# Patient Record
Sex: Male | Born: 1950 | Race: White | Hispanic: No | Marital: Married | Smoking: Former smoker
Health system: Southern US, Community
[De-identification: ages and names within clinical notes are randomized; demographics above are authoritative.]

## PROBLEM LIST (undated history)

## (undated) DIAGNOSIS — I10 Essential (primary) hypertension: Secondary | ICD-10-CM

## (undated) DIAGNOSIS — N411 Chronic prostatitis: Secondary | ICD-10-CM

## (undated) DIAGNOSIS — I4891 Unspecified atrial fibrillation: Secondary | ICD-10-CM

## (undated) HISTORY — PX: CARDIAC ELECTROPHYSIOLOGY MAPPING AND ABLATION: SHX1292

## (undated) HISTORY — PX: JOINT REPLACEMENT: SHX530

## (undated) HISTORY — PX: REPLACEMENT TOTAL KNEE: SUR1224

---

## 2008-12-10 ENCOUNTER — Observation Stay: Payer: Self-pay | Admitting: Internal Medicine

## 2012-05-17 DIAGNOSIS — G4733 Obstructive sleep apnea (adult) (pediatric): Secondary | ICD-10-CM | POA: Insufficient documentation

## 2012-05-20 DIAGNOSIS — Z79899 Other long term (current) drug therapy: Secondary | ICD-10-CM | POA: Insufficient documentation

## 2015-12-25 DIAGNOSIS — M1612 Unilateral primary osteoarthritis, left hip: Secondary | ICD-10-CM | POA: Insufficient documentation

## 2016-01-01 DIAGNOSIS — M1712 Unilateral primary osteoarthritis, left knee: Secondary | ICD-10-CM | POA: Insufficient documentation

## 2016-01-01 DIAGNOSIS — M1711 Unilateral primary osteoarthritis, right knee: Secondary | ICD-10-CM | POA: Insufficient documentation

## 2016-01-30 ENCOUNTER — Other Ambulatory Visit: Payer: Self-pay | Admitting: Orthopedic Surgery

## 2016-01-30 DIAGNOSIS — M1712 Unilateral primary osteoarthritis, left knee: Secondary | ICD-10-CM

## 2016-02-01 DIAGNOSIS — R972 Elevated prostate specific antigen [PSA]: Secondary | ICD-10-CM | POA: Insufficient documentation

## 2016-02-01 DIAGNOSIS — N401 Enlarged prostate with lower urinary tract symptoms: Secondary | ICD-10-CM | POA: Insufficient documentation

## 2016-02-16 ENCOUNTER — Ambulatory Visit
Admission: RE | Admit: 2016-02-16 | Discharge: 2016-02-16 | Disposition: A | Discharge status: Home / Self Care | Payer: Medicare Other | Source: Ambulatory Visit | Attending: Orthopedic Surgery | Admitting: Orthopedic Surgery

## 2016-02-16 DIAGNOSIS — M2392 Unspecified internal derangement of left knee: Secondary | ICD-10-CM | POA: Insufficient documentation

## 2016-02-16 DIAGNOSIS — M25362 Other instability, left knee: Secondary | ICD-10-CM | POA: Diagnosis present

## 2016-02-16 DIAGNOSIS — M1712 Unilateral primary osteoarthritis, left knee: Secondary | ICD-10-CM

## 2016-02-28 DIAGNOSIS — M249 Joint derangement, unspecified: Secondary | ICD-10-CM | POA: Insufficient documentation

## 2016-03-19 ENCOUNTER — Other Ambulatory Visit: Payer: Self-pay | Admitting: Unknown Physician Specialty

## 2016-03-19 DIAGNOSIS — M1712 Unilateral primary osteoarthritis, left knee: Secondary | ICD-10-CM

## 2016-03-20 ENCOUNTER — Other Ambulatory Visit: Payer: Medicare Other

## 2016-03-21 ENCOUNTER — Ambulatory Visit
Admission: RE | Admit: 2016-03-21 | Discharge: 2016-03-21 | Disposition: A | Discharge status: Home / Self Care | Payer: Medicare Other | Source: Ambulatory Visit | Attending: Unknown Physician Specialty | Admitting: Unknown Physician Specialty

## 2016-03-21 DIAGNOSIS — M160 Bilateral primary osteoarthritis of hip: Secondary | ICD-10-CM | POA: Diagnosis not present

## 2016-03-21 DIAGNOSIS — M1712 Unilateral primary osteoarthritis, left knee: Secondary | ICD-10-CM | POA: Diagnosis present

## 2016-03-26 DIAGNOSIS — M1611 Unilateral primary osteoarthritis, right hip: Secondary | ICD-10-CM | POA: Insufficient documentation

## 2016-04-03 ENCOUNTER — Encounter: Admission: RE | Payer: Self-pay | Source: Ambulatory Visit

## 2016-04-03 ENCOUNTER — Ambulatory Visit
Admission: RE | Admit: 2016-04-03 | Payer: Medicare Other | Source: Ambulatory Visit | Admitting: Unknown Physician Specialty

## 2016-04-03 ENCOUNTER — Encounter
Admission: RE | Admit: 2016-04-03 | Discharge: 2016-04-03 | Disposition: A | Discharge status: Home / Self Care | Payer: Medicare Other | Source: Ambulatory Visit | Attending: Internal Medicine | Admitting: Internal Medicine

## 2016-04-03 SURGERY — ARTHROSCOPY, KNEE
Anesthesia: Choice | Laterality: Left

## 2016-04-09 LAB — PROTIME-INR
INR: 1.13
PROTHROMBIN TIME: 14.7 s (ref 11.4–15.0)

## 2016-04-10 ENCOUNTER — Encounter: Payer: Self-pay | Admitting: Emergency Medicine

## 2016-04-10 ENCOUNTER — Emergency Department: Payer: Medicare Other

## 2016-04-10 ENCOUNTER — Inpatient Hospital Stay
Admission: EM | Admit: 2016-04-10 | Discharge: 2016-04-11 | DRG: 863 | Disposition: A | Discharge status: Skilled Nursing Facility | Payer: Medicare Other | Attending: Internal Medicine | Admitting: Internal Medicine

## 2016-04-10 DIAGNOSIS — Z888 Allergy status to other drugs, medicaments and biological substances status: Secondary | ICD-10-CM | POA: Diagnosis not present

## 2016-04-10 DIAGNOSIS — Z7901 Long term (current) use of anticoagulants: Secondary | ICD-10-CM | POA: Diagnosis not present

## 2016-04-10 DIAGNOSIS — Z96652 Presence of left artificial knee joint: Secondary | ICD-10-CM | POA: Diagnosis present

## 2016-04-10 DIAGNOSIS — I10 Essential (primary) hypertension: Secondary | ICD-10-CM | POA: Diagnosis present

## 2016-04-10 DIAGNOSIS — L0390 Cellulitis, unspecified: Secondary | ICD-10-CM | POA: Diagnosis present

## 2016-04-10 DIAGNOSIS — N411 Chronic prostatitis: Secondary | ICD-10-CM | POA: Diagnosis present

## 2016-04-10 DIAGNOSIS — Z818 Family history of other mental and behavioral disorders: Secondary | ICD-10-CM

## 2016-04-10 DIAGNOSIS — Y838 Other surgical procedures as the cause of abnormal reaction of the patient, or of later complication, without mention of misadventure at the time of the procedure: Secondary | ICD-10-CM | POA: Diagnosis present

## 2016-04-10 DIAGNOSIS — M1990 Unspecified osteoarthritis, unspecified site: Secondary | ICD-10-CM | POA: Diagnosis present

## 2016-04-10 DIAGNOSIS — I482 Chronic atrial fibrillation: Secondary | ICD-10-CM | POA: Diagnosis present

## 2016-04-10 DIAGNOSIS — Z79899 Other long term (current) drug therapy: Secondary | ICD-10-CM | POA: Diagnosis not present

## 2016-04-10 DIAGNOSIS — T814XXA Infection following a procedure, initial encounter: Principal | ICD-10-CM | POA: Diagnosis present

## 2016-04-10 DIAGNOSIS — Z8249 Family history of ischemic heart disease and other diseases of the circulatory system: Secondary | ICD-10-CM | POA: Diagnosis not present

## 2016-04-10 DIAGNOSIS — L03116 Cellulitis of left lower limb: Secondary | ICD-10-CM | POA: Diagnosis present

## 2016-04-10 HISTORY — DX: Chronic prostatitis: N41.1

## 2016-04-10 HISTORY — DX: Unspecified atrial fibrillation: I48.91

## 2016-04-10 HISTORY — DX: Essential (primary) hypertension: I10

## 2016-04-10 LAB — BASIC METABOLIC PANEL
Anion gap: 7 (ref 5–15)
BUN: 13 mg/dL (ref 6–20)
CALCIUM: 9 mg/dL (ref 8.9–10.3)
CO2: 29 mmol/L (ref 22–32)
CREATININE: 0.94 mg/dL (ref 0.61–1.24)
Chloride: 103 mmol/L (ref 101–111)
GFR calc non Af Amer: >60 mL/min (ref >60)
Glucose, Bld: 99 mg/dL (ref 65–99)
Potassium: 4.4 mmol/L (ref 3.5–5.1)
SODIUM: 139 mmol/L (ref 135–145)

## 2016-04-10 LAB — CBC
HCT: 38.3 % — ABNORMAL LOW (ref 40.0–52.0)
HCT: 39.6 % — ABNORMAL LOW (ref 40.0–52.0)
Hemoglobin: 13.2 g/dL (ref 13.0–18.0)
Hemoglobin: 13.5 g/dL (ref 13.0–18.0)
MCH: 31.3 pg (ref 26.0–34.0)
MCH: 31.6 pg (ref 26.0–34.0)
MCHC: 34.1 g/dL (ref 32.0–36.0)
MCHC: 34.3 g/dL (ref 32.0–36.0)
MCV: 91.6 fL (ref 80.0–100.0)
MCV: 92.1 fL (ref 80.0–100.0)
PLATELETS: 266 10*3/uL (ref 150–440)
PLATELETS: 287 10*3/uL (ref 150–440)
RBC: 4.16 MIL/uL — AB (ref 4.40–5.90)
RBC: 4.32 MIL/uL — AB (ref 4.40–5.90)
RDW: 12.9 % (ref 11.5–14.5)
RDW: 13 % (ref 11.5–14.5)
WBC: 8.3 10*3/uL (ref 3.8–10.6)
WBC: 8.7 10*3/uL (ref 3.8–10.6)

## 2016-04-10 LAB — COMPREHENSIVE METABOLIC PANEL
ALT: 25 U/L (ref 17–63)
AST: 28 U/L (ref 15–41)
Albumin: 3.6 g/dL (ref 3.5–5.0)
Alkaline Phosphatase: 59 U/L (ref 38–126)
Anion gap: 9 (ref 5–15)
BUN: 15 mg/dL (ref 6–20)
CHLORIDE: 98 mmol/L — AB (ref 101–111)
CO2: 27 mmol/L (ref 22–32)
CREATININE: 0.95 mg/dL (ref 0.61–1.24)
Calcium: 8.9 mg/dL (ref 8.9–10.3)
Glucose, Bld: 110 mg/dL — ABNORMAL HIGH (ref 65–99)
Potassium: 4.4 mmol/L (ref 3.5–5.1)
Sodium: 134 mmol/L — ABNORMAL LOW (ref 135–145)
Total Bilirubin: 0.4 mg/dL (ref 0.3–1.2)
Total Protein: 7.1 g/dL (ref 6.5–8.1)

## 2016-04-10 LAB — PROTIME-INR
INR: 1.1
INR: 1.15
PROTHROMBIN TIME: 14.4 s (ref 11.4–15.0)
PROTHROMBIN TIME: 14.9 s (ref 11.4–15.0)

## 2016-04-10 LAB — MRSA PCR SCREENING: MRSA BY PCR: NEGATIVE

## 2016-04-10 MED ORDER — TRAZODONE HCL 100 MG PO TABS
100.0000 mg | ORAL_TABLET | Freq: Every day | ORAL | Status: DC
  Administered 2016-04-10: 100 mg via ORAL
  Filled 2016-04-10: qty 1

## 2016-04-10 MED ORDER — SODIUM CHLORIDE 0.9 % IV SOLN: 250.0000 mL | INTRAVENOUS | Status: DC | PRN

## 2016-04-10 MED ORDER — METHOCARBAMOL 500 MG PO TABS
500.0000 mg | ORAL_TABLET | Freq: Four times a day (QID) | ORAL | Status: DC | PRN
  Administered 2016-04-11: 500 mg via ORAL
  Filled 2016-04-10: qty 1

## 2016-04-10 MED ORDER — WARFARIN SODIUM 5 MG PO TABS: 5.0000 mg | ORAL_TABLET | Freq: Every day | ORAL | Status: DC

## 2016-04-10 MED ORDER — HYDROCHLOROTHIAZIDE 12.5 MG PO CAPS
12.5000 mg | ORAL_CAPSULE | Freq: Every day | ORAL | Status: DC
  Administered 2016-04-10 – 2016-04-11 (×2): 12.5 mg via ORAL
  Filled 2016-04-10: qty 1

## 2016-04-10 MED ORDER — BISACODYL 10 MG RE SUPP: 10.0000 mg | RECTAL | Status: DC | PRN

## 2016-04-10 MED ORDER — CARBOXYMETHYLCELLULOSE SODIUM 0.5 % OP SOLN: 1.0000 [drp] | Freq: Three times a day (TID) | OPHTHALMIC | Status: DC | PRN

## 2016-04-10 MED ORDER — MORPHINE SULFATE (PF) 2 MG/ML IV SOLN
2.0000 mg | INTRAVENOUS | Status: DC | PRN
  Administered 2016-04-11: 2 mg via INTRAVENOUS
  Filled 2016-04-10: qty 1

## 2016-04-10 MED ORDER — WARFARIN - PHARMACIST DOSING INPATIENT: Freq: Every day | Status: DC

## 2016-04-10 MED ORDER — ACETAMINOPHEN 325 MG PO TABS
650.0000 mg | ORAL_TABLET | Freq: Four times a day (QID) | ORAL | Status: DC | PRN
  Administered 2016-04-10 – 2016-04-11 (×2): 650 mg via ORAL
  Filled 2016-04-10 (×2): qty 2

## 2016-04-10 MED ORDER — ONDANSETRON HCL 4 MG/2ML IJ SOLN: 4.0000 mg | Freq: Four times a day (QID) | INTRAMUSCULAR | Status: DC | PRN

## 2016-04-10 MED ORDER — MAGNESIUM HYDROXIDE 400 MG/5ML PO SUSP: 30.0000 mL | ORAL | Status: DC | PRN

## 2016-04-10 MED ORDER — WARFARIN SODIUM 5 MG PO TABS
5.0000 mg | ORAL_TABLET | Freq: Once | ORAL | Status: AC
  Administered 2016-04-10: 5 mg via ORAL
  Filled 2016-04-10: qty 1

## 2016-04-10 MED ORDER — SODIUM CHLORIDE 0.9% FLUSH: 3.0000 mL | INTRAVENOUS | Status: DC | PRN

## 2016-04-10 MED ORDER — VANCOMYCIN HCL IN DEXTROSE 1-5 GM/200ML-% IV SOLN
1000.0000 mg | Freq: Once | INTRAVENOUS | Status: AC
  Administered 2016-04-10: 1000 mg via INTRAVENOUS

## 2016-04-10 MED ORDER — SODIUM CHLORIDE 0.9% FLUSH
3.0000 mL | Freq: Two times a day (BID) | INTRAVENOUS | Status: DC
  Administered 2016-04-10 – 2016-04-11 (×3): 3 mL via INTRAVENOUS

## 2016-04-10 MED ORDER — DOFETILIDE 250 MCG PO CAPS
375.0000 ug | ORAL_CAPSULE | Freq: Two times a day (BID) | ORAL | Status: DC
  Administered 2016-04-10 – 2016-04-11 (×3): 375 ug via ORAL
  Filled 2016-04-10 (×4): qty 1

## 2016-04-10 MED ORDER — METOPROLOL SUCCINATE ER 25 MG PO TB24
25.0000 mg | ORAL_TABLET | Freq: Two times a day (BID) | ORAL | Status: DC
  Administered 2016-04-10 – 2016-04-11 (×2): 25 mg via ORAL
  Filled 2016-04-10 (×2): qty 1

## 2016-04-10 MED ORDER — ONDANSETRON HCL 4 MG PO TABS: 4.0000 mg | ORAL_TABLET | Freq: Four times a day (QID) | ORAL | Status: DC | PRN

## 2016-04-10 MED ORDER — LOSARTAN POTASSIUM 50 MG PO TABS
100.0000 mg | ORAL_TABLET | Freq: Every day | ORAL | Status: DC
  Administered 2016-04-10 – 2016-04-11 (×2): 100 mg via ORAL
  Filled 2016-04-10: qty 2

## 2016-04-10 MED ORDER — OXYCODONE HCL 5 MG PO TABS
10.0000 mg | ORAL_TABLET | ORAL | Status: DC | PRN
  Administered 2016-04-10 – 2016-04-11 (×5): 10 mg via ORAL
  Filled 2016-04-10 (×5): qty 2

## 2016-04-10 MED ORDER — VANCOMYCIN HCL IN DEXTROSE 1-5 GM/200ML-% IV SOLN: 1000.0000 mg | Freq: Once | INTRAVENOUS | Status: DC

## 2016-04-10 MED ORDER — BISACODYL 10 MG/30ML RE ENEM: 10.0000 mg | ENEMA | RECTAL | Status: DC

## 2016-04-10 MED ORDER — VANCOMYCIN HCL 10 G IV SOLR
1250.0000 mg | Freq: Three times a day (TID) | INTRAVENOUS | Status: DC
  Administered 2016-04-10 – 2016-04-11 (×4): 1250 mg via INTRAVENOUS
  Filled 2016-04-10 (×6): qty 1250

## 2016-04-10 MED ORDER — OXYCODONE HCL 5 MG PO TABS
5.0000 mg | ORAL_TABLET | ORAL | Status: DC | PRN
  Administered 2016-04-10 (×3): 5 mg via ORAL
  Filled 2016-04-10: qty 1

## 2016-04-10 MED ORDER — PIPERACILLIN-TAZOBACTAM 3.375 G IVPB
3.3750 g | Freq: Once | INTRAVENOUS | Status: AC
  Administered 2016-04-10: 3.375 g via INTRAVENOUS

## 2016-04-10 MED ORDER — PIPERACILLIN-TAZOBACTAM 3.375 G IVPB 30 MIN: 3.3750 g | Freq: Once | INTRAVENOUS | Status: DC

## 2016-04-10 MED ORDER — POLYVINYL ALCOHOL 1.4 % OP SOLN: 1.0000 [drp] | Freq: Three times a day (TID) | OPHTHALMIC | Status: DC | PRN

## 2016-04-10 MED ORDER — OXYCODONE HCL ER 15 MG PO T12A: 15.0000 mg | EXTENDED_RELEASE_TABLET | Freq: Two times a day (BID) | ORAL | Status: DC

## 2016-04-10 MED ORDER — PIPERACILLIN-TAZOBACTAM 3.375 G IVPB
3.3750 g | Freq: Three times a day (TID) | INTRAVENOUS | Status: DC
  Administered 2016-04-10 – 2016-04-11 (×3): 3.375 g via INTRAVENOUS
  Filled 2016-04-10 (×6): qty 50

## 2016-04-10 MED ORDER — SENNOSIDES-DOCUSATE SODIUM 8.6-50 MG PO TABS
1.0000 | ORAL_TABLET | Freq: Two times a day (BID) | ORAL | Status: DC
  Administered 2016-04-10 – 2016-04-11 (×3): 1 via ORAL
  Filled 2016-04-10 (×2): qty 1

## 2016-04-10 MED ORDER — FLEET ENEMA 7-19 GM/118ML RE ENEM: 1.0000 | ENEMA | RECTAL | Status: DC | PRN

## 2016-04-10 MED ORDER — SERTRALINE HCL 50 MG PO TABS
50.0000 mg | ORAL_TABLET | Freq: Every day | ORAL | Status: DC
  Administered 2016-04-11: 50 mg via ORAL
  Filled 2016-04-10: qty 1

## 2016-04-10 MED ORDER — DILTIAZEM HCL ER COATED BEADS 120 MG PO CP24
240.0000 mg | ORAL_CAPSULE | Freq: Every day | ORAL | Status: DC
Start: 2016-04-10 — End: 2016-04-11
  Administered 2016-04-10 – 2016-04-11 (×2): 240 mg via ORAL
  Filled 2016-04-10: qty 2

## 2016-04-10 MED ORDER — ENOXAPARIN SODIUM 120 MG/0.8ML ~~LOC~~ SOLN
1.0000 mg/kg | Freq: Two times a day (BID) | SUBCUTANEOUS | Status: DC
  Administered 2016-04-10: 120 mg via SUBCUTANEOUS
  Filled 2016-04-10 (×2): qty 0.8

## 2016-04-10 MED ORDER — OXYCODONE HCL 5 MG PO TABS
5.0000 mg | ORAL_TABLET | ORAL | Status: DC | PRN
Start: 2016-04-10 — End: 2016-04-10
  Administered 2016-04-10: 10 mg via ORAL
  Filled 2016-04-10: qty 2

## 2016-04-10 NOTE — Clinical Social Work Note (Signed)
Clinical Social Work Assessment  Patient Details  Name: Shane Lamb MRN: [id number] Date of Birth: Jul 31, 1951  Date of referral:  04/10/16               Reason for consult:  Facility Placement                Permission sought to share information with:  Chartered certified accountant granted to share information::  Yes, Verbal Permission Granted  Name::      Shane::   Lamb   Relationship::     Contact Information:     Housing/Transportation Living arrangements for the past 2 months:  [place], [place] of Information:  Patient Patient Interpreter Needed:  None Criminal Activity/Legal Involvement Pertinent to Current Situation/Hospitalization:  No - Comment as needed Significant Relationships:  Friend Lives with:  Self Do you feel safe going back to the place where you live?  Yes Need for family participation in patient care:  Yes (Comment)  Care giving concerns:  Patient lives alone in [place].    Social Worker assessment / plan:  Holiday representative (CSW) reviewed chart and noted that patient is from [facility] Per Kim admissions coordinator at [facility] patient has been at [facility] for a few days however they have no beds so they cannot accept him back. CSW met with patient to discuss D/C plan. Patient was alert and oriented and sitting up in the bed. CSW introduced self and explained role of CSW department. Patient reported that he lives in his mother's house in [place] and went to [facility] for rehab. CSW explained that [facility] does not have a bed available for patient to return to. Patient is agreeable to SNF search in [facility]. FL2 complete and faxed out.   CSW presented bed offers. Patient reported that he will review bed offers and get back to CSW with a choice.   Employment status:  Disabled (Comment on whether or not currently receiving Disability), Retired Radiation protection practitioner:  Medicare PT Recommendations:  Not assessed at this time Information / Referral to community resources:  [facility]  Patient/Family's Response to care:  Patient is agreeable to going to another facility besides [facility].   Patient/Family's Understanding of and Emotional Response to Diagnosis, Current Treatment, and Prognosis:  Patient was pleasant and thanked CSW for visit.   Emotional Assessment Appearance:  Appears stated age Attitude/Demeanor/Rapport:    Affect (typically observed):  Accepting, Adaptable, Pleasant Orientation:  Oriented to Self, Oriented to Place, Oriented to  Time, Oriented to Situation Alcohol / Substance use:  Not Applicable Psych involvement (Current and /or in the community):  No (Comment)  Discharge Needs  Concerns to be addressed:  Discharge Planning Concerns Readmission within the last 30 days:  No Current discharge risk:  Dependent with Mobility Barriers to Discharge:  Continued Medical Work up   Elwyn Reach 04/10/2016, 3:47 PM

## 2016-04-10 NOTE — ED Notes (Signed)
Pt returned from ultrasound

## 2016-04-10 NOTE — Progress Notes (Signed)
Order received from Dr. Gavin PottersKernodle to stop oxycontin and oxycodone 5mg -10mg . Order received for oxycodone 10mg  q 4hrs prn and to start PT for range of motion

## 2016-04-10 NOTE — Progress Notes (Signed)
I examined patient's left lower extremity today. The patient's incision and puncture wounds were healing and were only slightly tender. There was no evidence of infection in regard to his surgical sites. There was some residual swelling in his left leg but minimal swelling about the left thigh. There was no erythematous change. Active and passive motion of his left knee did produce some discomfort. He did have full extension of his left knee but only had about 30-40 of flexion. Attempts at further flexion produced left knee pain. The patient could straight leg raise without too much difficulty.  The patient was still experiencing moderate discomfort. I changed his oxycodone to 10 mg every 4 hours. Foot pumps were reinstituted. Physical therapy is to be started referable to instituting range of motion and strengthening of his left knee. Once his antibiotics are discontinued he should be able to be discharged back to a skilled nursing facility. He will need to follow-up with [facility] in [place] in about 1 week. Temperature is 99.5 at present.

## 2016-04-10 NOTE — Consult Note (Signed)
Patient is a week out from a partial left knee replacement done and arm by Dr. Erin SonsHarold Lamb. He has been in rehabilitation and noticed swelling and increased pain over the left knee over the past 2 days and can emergency room over the night and is being admitted for infection. There is extensive cellulitis involving the left thigh knee and calf. There is no drainage at present. He has difficulty moving the knee.  On examination he holds the knee in about 20 flexion there is mild effusion the knee is not particularly warm compared to the adjacent tissue he is tender medially well above the knee as well as into the calf. No excess erythema around his staple sites from navigation pins or skin incision   Impression extensive cellulitis and setting of recent partial knee replacement  Plan agree with current antibiotics. Holding off on aspiration of the knee and presents because improving I could see the joint with cellulitis extending around the knee. We'll inform Dr. Gavin Lamb total of patient's admission.

## 2016-04-10 NOTE — ED Provider Notes (Signed)
[facility] Emergency Department Provider Note  ____________________________________________  Time seen: 12:19 AM  I have reviewed the triage vital signs and the nursing notes.   HISTORY  Chief Complaint Joint Swelling; Post-op Problem; and Prosthetic Joint Infection      HPI Shane Lamb is a 65 y.o. male presents from [facility] with left leg pain, swelling and redness status post left knee replacement performed by Dr. Gavin PottersKernodle on May 22. In addition patient admits to low-grade fever and chills. Patient stated that he notify the nursing staff at [facility] and was prescribed Keflex however he has noted increased swelling redness and discomfort in his leg since Keflex was started.     Past Medical History  Diagnosis Date  . Hypertension   . Atrial fibrillation (HCC)   . Prostatitis, chronic     There are no active problems to display for this patient.   Past Surgical History  Procedure Laterality Date  . Replacement total knee      No current outpatient prescriptions on file.  Allergies Amiodarone  History reviewed. No pertinent family history.  Social History Social History  Substance Use Topics  . Smoking status: Never Smoker   . Smokeless tobacco: None  . Alcohol Use: Yes    Review of Systems  Constitutional: Negative for fever. Eyes: Negative for visual changes. ENT: Negative for sore throat. Cardiovascular: Negative for chest pain. Respiratory: Negative for shortness of breath. Gastrointestinal: Negative for abdominal pain, vomiting and diarrhea. Genitourinary: Negative for dysuria. Musculoskeletal: Negative for back pain.Positive for left leg swelling Skin: Positive for left leg redness Neurological: Negative for headaches, focal weakness or numbness.   10-point ROS otherwise negative.  ____________________________________________   PHYSICAL EXAM:  VITAL SIGNS: ED Triage Vitals  Enc Vitals  Group     BP 04/10/16 0019 182/90 mmHg     Pulse Rate 04/10/16 0019 76     Resp 04/10/16 0019 20     Temp 04/10/16 0019 98.7 F (37.1 C)     Temp Source 04/10/16 0019 Oral     SpO2 04/10/16 0019 99 %     Weight 04/10/16 0019 260 lb (117.935 kg)     Height 04/10/16 0019 6\' 2"  (1.88 m)     Head Cir --      Peak Flow --      Pain Score 04/10/16 0020 0     Pain Loc --      Pain Edu? --      Excl. in GC? --     Constitutional: Alert and oriented. Well appearing and in no distress. Eyes: Conjunctivae are normal. PERRL. Normal extraocular movements. ENT   Head: Normocephalic and atraumatic.   Nose: No congestion/rhinnorhea.   Mouth/Throat: Mucous membranes are moist.   Neck: No stridor. Hematological/Lymphatic/Immunilogical: No cervical lymphadenopathy. Cardiovascular: Normal rate, regular rhythm. Normal and symmetric distal pulses are present in all extremities. No murmurs, rubs, or gallops. Respiratory: Normal respiratory effort without tachypnea nor retractions. Breath sounds are clear and equal bilaterally. No wheezes/rales/rhonchi. Gastrointestinal: Soft and nontender. No distention. There is no CVA tenderness. Genitourinary: deferred Musculoskeletal: Nontender with normal range of motion in all extremities. No joint effusions.  Left lower extremity nonpitting edema  Neurologic:  Normal speech and language. No gross focal neurologic deficits are appreciated. Speech is normal.  Skin:  Blanching erythema proximal anterior left leg extending to proximal medial thigh Psychiatric: Mood and affect are normal. Speech and behavior are normal. Patient exhibits appropriate insight and  judgment.  ____________________________________________    LABS (pertinent positives/negatives)  Labs Reviewed  CBC - Abnormal; Notable for the following:    RBC 4.32 (*)    HCT 39.6 (*)    All other components within normal limits  COMPREHENSIVE METABOLIC PANEL - Abnormal; Notable for the  following:    Sodium 134 (*)    Chloride 98 (*)    Glucose, Bld 110 (*)    All other components within normal limits  BLOOD GAS, VENOUS - Abnormal; Notable for the following:    pCO2, Ven 43 (*)    pO2, Ven 47.0 (*)    All other components within normal limits  CULTURE, BLOOD (ROUTINE X 2)  CULTURE, BLOOD (ROUTINE X 2)  PROTIME-INR         RADIOLOGY  US Venous Img Lower Unilateral Left (Final result) Result time: 04/10/16 01:34:12   Final result by Rad Results In Interface (04/10/16 01:34:12)   Narrative:   CLINICAL DATA: Acute onset of left leg pain and swelling, status post left knee replacement. Initial encounter.  EXAM: LEFT LOWER EXTREMITY VENOUS DOPPLER ULTRASOUND  TECHNIQUE: Gray-scale sonography with graded compression, as well as color Doppler and duplex ultrasound were performed to evaluate the lower extremity deep venous systems from the level of the common femoral vein and including the common femoral, femoral, profunda femoral, popliteal and calf veins including the posterior tibial, peroneal and gastrocnemius veins when visible. The superficial great saphenous vein was also interrogated. Spectral Doppler was utilized to evaluate flow at rest and with distal augmentation maneuvers in the common femoral, femoral and popliteal veins.  COMPARISON: Left knee MRI performed 02/16/2016, and left knee CT performed 03/21/2016  FINDINGS: Contralateral Common Femoral Vein: Respiratory phasicity is normal and symmetric with the symptomatic side. No evidence of thrombus. Normal compressibility.  Common Femoral Vein: No evidence of thrombus. Normal compressibility, respiratory phasicity and response to augmentation.  Saphenofemoral Junction: No evidence of thrombus. Normal compressibility and flow on color Doppler imaging.  Profunda Femoral Vein: No evidence of thrombus. Normal compressibility and flow on color Doppler imaging.  Femoral Vein: No evidence  of thrombus. Normal compressibility, respiratory phasicity and response to augmentation.  Popliteal Vein: No evidence of thrombus. Normal compressibility, respiratory phasicity and response to augmentation.  Calf Veins: No evidence of thrombus. Normal compressibility and flow on color Doppler imaging.  Superficial Great Saphenous Vein: No evidence of thrombus. Normal compressibility and flow on color Doppler imaging.  Venous Reflux: None.  Other Findings: None.  IMPRESSION: No evidence of deep venous thrombosis.   Electronically Signed By: Roanna Raider M.D. On: 04/10/2016 01:34          INITIAL IMPRESSION / ASSESSMENT AND PLAN / ED COURSE  Pertinent labs & imaging results that were available during my care of the patient were reviewed by me and considered in my medical decision making (see chart for details).  IV vancomycin and Zosyn given. Patient discussed with Dr. Joneen Roach for hospital admission  ____________________________________________   FINAL CLINICAL IMPRESSION(S) / ED DIAGNOSES  Final diagnoses:  Left leg cellulitis      Darci Current, MD 04/10/16 [phone]

## 2016-04-10 NOTE — ED Notes (Signed)
Pt arrived to the ED via EMS from  [facility] for post-op problems. Pt states that he had a left knee replacement but for the last 2 days has been experiencing pain, area swelling and pain. Was placed on antibiotics but wanted a second opinion. Pt is AOx4 in mild pain distress.

## 2016-04-10 NOTE — ED Notes (Signed)
Pt transported to room 147 

## 2016-04-10 NOTE — ED Notes (Signed)
Pt went to ultrasound.

## 2016-04-10 NOTE — ED Notes (Signed)
MD Manson PasseyBrown and Pyreddy at bedside at this time

## 2016-04-10 NOTE — Clinical Social Work Placement (Signed)
   CLINICAL SOCIAL WORK PLACEMENT  NOTE  Date:  04/10/2016  Patient Details  Name: Shane Lamb MRN: [id number] Date of Birth: 21-Jan-1951  Clinical Social Work is seeking post-discharge placement for this patient at the Skilled  Nursing Facility level of care (*CSW will initial, date and re-position this form in  chart as items are completed):  Yes   Patient/family provided with [facility] Clinical Social Work Department's list of facilities offering this level of care within the geographic area requested by the patient (or if unable, by the patient's family).  Yes   Patient/family informed of their freedom to choose among providers that offer the needed level of care, that participate in Medicare, Medicaid or managed care program needed by the patient, have an available bed and are willing to accept the patient.  Yes   Patient/family informed of [facility] ownership interest in [facility] and [facility], as well as of the fact that they are under no obligation to receive care at these facilities.  PASRR submitted to EDS on       PASRR number received on       Existing PASRR number confirmed on 04/10/16     FL2 transmitted to all facilities in geographic area requested by pt/family on 04/10/16     FL2 transmitted to all facilities within larger geographic area on       Patient informed that his/her managed care company has contracts with or will negotiate with certain facilities, including the following:        Yes   Patient/family informed of bed offers received.  Patient chooses bed at       Physician recommends and patient chooses bed at      Patient to be transferred to   on  .  Patient to be transferred to facility by       Patient family notified on   of transfer.  Name of family member notified:        PHYSICIAN       Additional Comment:    _______________________________________________ Haig ProphetMorgan, Harue Pribble G, LCSW 04/10/2016, 3:46 PM

## 2016-04-10 NOTE — Progress Notes (Signed)
Pharmacy Antibiotic Note  Shane RoanLarry D Lamb is a 65 y.o. male admitted on 04/10/2016 with cellulitis.  Pharmacy has been consulted for vancomycin and Zosyn dosing.  Plan: DW 96kg  Vd 67L kei 0.091 hr-1  1/2 8 hours Vancomycin 1250 mg q 8 hours ordered. Not candidate for stacked dosing. Level before 5th dose. Goal trough 15-20.  Zosyn 3.375 grams q 8 hours ordered  Height: 6\' 2"  (188 cm) Weight: 260 lb (117.935 kg) IBW/kg (Calculated) : 82.2  Temp (24hrs), Avg:99.2 F (37.3 C), Min:98.7 F (37.1 C), Max:99.6 F (37.6 C)   Recent Labs Lab 04/10/16 0020  WBC 8.7  CREATININE 0.95    Estimated Creatinine Clearance: 104.4 mL/min (by C-G formula based on Cr of 0.95).    Allergies  Allergen Reactions  . Amiodarone Nausea And Vomiting    Antimicrobials this admission: vancomycin  >>  Zosyn  >>   Dose adjustments this admission:   Microbiology results: 5/31 BCx: pending   Thank you for allowing pharmacy to be a part of this patient's care.  Nicha Hemann S 04/10/2016 4:06 AM

## 2016-04-10 NOTE — Progress Notes (Signed)
Spoke with Dr. Guillermina City[facility]'s nurse, Cordelia PenSherry. Please place orders for V-Pulse if pt has with him, if not use [facility]'s protocol orders for foot pumps and ice pack/polar care.

## 2016-04-10 NOTE — Progress Notes (Signed)
ANTICOAGULATION CONSULT NOTE - Initial Consult  Pharmacy Consult for warfarin Indication: atrial fibrillation  Allergies  Allergen Reactions  . Amiodarone Nausea And Vomiting   Patient Measurements: Height: 6\' 2"  (188 cm) Weight: 260 lb (117.935 kg) IBW/kg (Calculated) : 82.2  Vital Signs: Temp: 99.1 F (37.3 C) (05/31 1128) Temp Source: Oral (05/31 1128) BP: 151/80 mmHg (05/31 1128) Pulse Rate: 76 (05/31 1128)  Labs:  Recent Labs  04/09/16 0900 04/10/16 0020 04/10/16 0559  HGB  --  13.5 13.2  HCT  --  39.6* 38.3*  PLT  --  287 266  LABPROT 14.7 14.4 14.9  INR 1.13 1.10 1.15  CREATININE  --  0.95 0.94   Estimated Creatinine Clearance: 105.5 mL/min (by C-G formula based on Cr of 0.94).  Medical History: Past Medical History  Diagnosis Date  . Hypertension   . Atrial fibrillation (HCC)   . Prostatitis, chronic    Medications:  Scheduled:  . diltiazem  240 mg Oral Daily  . dofetilide  375 mcg Oral BID  . hydrochlorothiazide  12.5 mg Oral Daily  . losartan  100 mg Oral Daily  . metoprolol succinate  25 mg Oral BID  . piperacillin-tazobactam (ZOSYN)  IV  3.375 g Intravenous Q8H  . senna-docusate  1 tablet Oral BID  . sertraline  50 mg Oral Daily  . sodium chloride flush  3 mL Intravenous Q12H  . traZODone  100 mg Oral QHS  . vancomycin  1,250 mg Intravenous Q8H  . warfarin  5 mg Oral ONCE-1800  . Warfarin - Pharmacist Dosing Inpatient   Does not apply q1800    Assessment: Pharmacy consulted to dose and monitor warfarin in this 65 year old male who was taking warfarin prior to admission for atrial fibrillation. Patient was prescribed 3 mg PO daily PTA and appears to be a relatively new start. Patient was initially prescribed therapeutic enoxaparin on admission, which was discontinued today.  INR of 1.15 is subtherapeutic on admission  Dosing history: Date INR Dose 5/31 1.15 5 mg  Goal of Therapy:  INR 2-3 Monitor platelets by anticoagulation protocol:  Yes   Plan:  Will order warfarin 5 mg PO x 1 dose tonight @ 1800 INR will be monitored daily  Thank you for allowing pharmacy to be involved in this patient's care.   Cindi CarbonMary M Melina Mosteller, PharmD Clinical Pharmacist 04/10/2016,3:04 PM

## 2016-04-10 NOTE — H&P (Signed)
[facility] - [place] at [facility]   PATIENT NAME: Shane Lamb    MR#:  [id number]  DATE OF BIRTH:  [birth year]  DATE OF ADMISSION:  04/10/2016  PRIMARY CARE PHYSICIAN: Pcp Not In System   REQUESTING/REFERRING PHYSICIAN:   CHIEF COMPLAINT:   Chief Complaint  Patient presents with  . Joint Swelling  . Post-op Problem  . Prosthetic Joint Infection    HISTORY OF PRESENT ILLNESS: Shane Lamb  is a 65 y.o. male with a known history of Atrial fibrillation, hypertension, chronic prostatitis, left knee replacement a week ago for osteoarthritis was referred from [facility] rehabilitation facility for swelling and pain and tenderness in the left knee and redness around the left knee and extending onto the left leg. The pain is aching in nature 5 out of 10 on a scale of 1-10. Patient is currently in rehabilitation after he was discharged from the hospital after he had a knee replacement. No history of chest pain no history of shortness of breath. Had low-grade fever but no chills. No history of fall. No history of any abdominal pain nausea vomiting diarrhea. No history of any GI bleed. Hospitalist service was consulted for further care of the patient for cellulitis of the left knee and left leg. Patient was worked up with a Doppler venous ultrasound of the left lower extremity in the emergency room which showed no DVT. Patient received IV vancomycin and IV Zosyn antibiotics in the emergency room.  PAST MEDICAL HISTORY:   Past Medical History  Diagnosis Date  . Hypertension   . Atrial fibrillation (HCC)   . Prostatitis, chronic     PAST SURGICAL HISTORY: Past Surgical History  Procedure Laterality Date  . Replacement total knee      SOCIAL HISTORY:  Social History  Substance Use Topics  . Smoking status: Never Smoker   . Smokeless tobacco: Not on file  . Alcohol Use: Yes    FAMILY HISTORY:  Family History  Problem Relation Age of Onset  . Hypertension  Father   . Depression Father     DRUG ALLERGIES:  Allergies  Allergen Reactions  . Amiodarone Nausea And Vomiting    REVIEW OF SYSTEMS:   CONSTITUTIONAL: Low grade fever noted, no fatigue or weakness.  EYES: No blurred or double vision.  EARS, NOSE, AND THROAT: No tinnitus or ear pain.  RESPIRATORY: No cough, shortness of breath, wheezing or hemoptysis.  CARDIOVASCULAR: No chest pain, orthopnea, edema.  GASTROINTESTINAL: No nausea, vomiting, diarrhea or abdominal pain.  GENITOURINARY: No dysuria, hematuria.  ENDOCRINE: No polyuria, nocturia,  HEMATOLOGY: No anemia, easy bruising or bleeding SKIN: No rash or lesion. MUSCULOSKELETAL: Left knee pain and redness left leg. NEUROLOGIC: No tingling, numbness, weakness.  PSYCHIATRY: No anxiety or depression.   MEDICATIONS AT HOME:  Prior to Admission medications   Medication Sig Start Date End Date Taking? Authorizing Provider  acetaminophen (TYLENOL) 325 MG tablet Take 650 mg by mouth every 6 (six) hours as needed.   Yes Historical Provider, MD  bisacodyl (DULCOLAX) 10 MG suppository Place 10 mg rectally as needed for moderate constipation.   Yes Historical Provider, MD  bisacodyl (FLEET) 10 MG/30ML ENEM Place 10 mg rectally See admin instructions. Every 72 hours prn   Yes Historical Provider, MD  carboxymethylcellulose (REFRESH PLUS) 0.5 % SOLN Place 1 drop into both eyes 3 (three) times daily as needed.   Yes Historical Provider, MD  cephALEXin (KEFLEX) 500 MG capsule Take 500 mg by mouth daily.  Yes Historical Provider, MD  diltiazem (DILACOR XR) 240 MG 24 hr capsule Take 240 mg by mouth daily.   Yes Historical Provider, MD  dofetilide (TIKOSYN) 125 MCG capsule Take 375 mcg by mouth 2 (two) times daily.   Yes Historical Provider, MD  hydrochlorothiazide (MICROZIDE) 12.5 MG capsule Take 12.5 mg by mouth daily.   Yes Historical Provider, MD  losartan (COZAAR) 100 MG tablet Take 100 mg by mouth daily.   Yes Historical Provider, MD   magnesium hydroxide (MILK OF MAGNESIA) 400 MG/5ML suspension Take 30 mLs by mouth every 4 (four) hours as needed for mild constipation.   Yes Historical Provider, MD  methocarbamol (ROBAXIN) 500 MG tablet Take 500 mg by mouth every 6 (six) hours as needed for muscle spasms.   Yes Historical Provider, MD  metoprolol succinate (TOPROL-XL) 25 MG 24 hr tablet Take 25 mg by mouth 2 (two) times daily.   Yes Historical Provider, MD  oxycodone (OXY-IR) 5 MG capsule Take 5-15 mg by mouth every 3 (three) hours as needed.   Yes Historical Provider, MD  senna-docusate (SENOKOT-S) 8.6-50 MG tablet Take 1 tablet by mouth 2 (two) times daily.   Yes Historical Provider, MD  sertraline (ZOLOFT) 50 MG tablet Take 50 mg by mouth daily.   Yes Historical Provider, MD  sildenafil (VIAGRA) 100 MG tablet Take 100 mg by mouth daily as needed for erectile dysfunction.   Yes Historical Provider, MD  traZODone (DESYREL) 100 MG tablet Take 100 mg by mouth at bedtime.   Yes Historical Provider, MD  warfarin (COUMADIN) 2.5 MG tablet Take 2.5 mg by mouth daily.   Yes Historical Provider, MD  warfarin (COUMADIN) 3 MG tablet Take 3 mg by mouth daily.   Yes Historical Provider, MD      PHYSICAL EXAMINATION:   VITAL SIGNS: Blood pressure 170/60, pulse 68, temperature 98.7 F (37.1 C), temperature source Oral, resp. rate 18, height 6\' 2"  (1.88 m), weight 117.935 kg (260 lb), SpO2 96 %.  GENERAL:  65 y.o.-year-old patient lying in the bed with no acute distress.  EYES: Pupils equal, round, reactive to light and accommodation. No scleral icterus. Extraocular muscles intact.  HEENT: Head atraumatic, normocephalic. Oropharynx and nasopharynx clear.  NECK:  Supple, no jugular venous distention. No thyroid enlargement, no tenderness.  LUNGS: Normal breath sounds bilaterally, no wheezing, rales,rhonchi or crepitation. No use of accessory muscles of respiration.  CARDIOVASCULAR: S1, S2 normal. No murmurs, rubs, or gallops.  ABDOMEN:  Soft, nontender, nondistended. Bowel sounds present. No organomegaly or mass.  EXTREMITIES: redness around left knee,swelling around left knee, redness over anterior part of left leg.Tenderness of left knee . Staples intact.  NEUROLOGIC: Cranial nerves II through XII are intact. Muscle strength 5/5 in all extremities. Sensation intact. Gait not checked.  PSYCHIATRIC: The patient is alert and oriented x 3.  SKIN: No obvious rash, lesion, or ulcer.   LABORATORY PANEL:   CBC  Recent Labs Lab 04/10/16 0020  WBC 8.7  HGB 13.5  HCT 39.6*  PLT 287  MCV 91.6  MCH 31.3  MCHC 34.1  RDW 12.9   ------------------------------------------------------------------------------------------------------------------  Chemistries   Recent Labs Lab 04/10/16 0020  NA 134*  K 4.4  CL 98*  CO2 27  GLUCOSE 110*  BUN 15  CREATININE 0.95  CALCIUM 8.9  AST 28  ALT 25  ALKPHOS 59  BILITOT 0.4   ------------------------------------------------------------------------------------------------------------------ estimated creatinine clearance is 104.4 mL/min (by C-G formula based on Cr of 0.95). ------------------------------------------------------------------------------------------------------------------ No results  for input(s): TSH, T4TOTAL, T3FREE, THYROIDAB in the last 72 hours.  Invalid input(s): FREET3   Coagulation profile  Recent Labs Lab 04/09/16 0900 04/10/16 0020  INR 1.13 1.10   ------------------------------------------------------------------------------------------------------------------- No results for input(s): DDIMER in the last 72 hours. -------------------------------------------------------------------------------------------------------------------  Cardiac Enzymes No results for input(s): CKMB, TROPONINI, MYOGLOBIN in the last 168 hours.  Invalid input(s):  CK ------------------------------------------------------------------------------------------------------------------ Invalid input(s): POCBNP  ---------------------------------------------------------------------------------------------------------------  Urinalysis No results found for: COLORURINE, APPEARANCEUR, LABSPEC, PHURINE, GLUCOSEU, HGBUR, BILIRUBINUR, KETONESUR, PROTEINUR, UROBILINOGEN, NITRITE, LEUKOCYTESUR   RADIOLOGY: US Venous Img Lower Unilateral Left  04/10/2016  CLINICAL DATA:  Acute onset of left leg pain and swelling, status post left knee replacement. Initial encounter. EXAM: LEFT LOWER EXTREMITY VENOUS DOPPLER ULTRASOUND TECHNIQUE: Gray-scale sonography with graded compression, as well as color Doppler and duplex ultrasound were performed to evaluate the lower extremity deep venous systems from the level of the common femoral vein and including the common femoral, femoral, profunda femoral, popliteal and calf veins including the posterior tibial, peroneal and gastrocnemius veins when visible. The superficial great saphenous vein was also interrogated. Spectral Doppler was utilized to evaluate flow at rest and with distal augmentation maneuvers in the common femoral, femoral and popliteal veins. COMPARISON:  Left knee MRI performed 02/16/2016, and left knee CT performed 03/21/2016 FINDINGS: Contralateral Common Femoral Vein: Respiratory phasicity is normal and symmetric with the symptomatic side. No evidence of thrombus. Normal compressibility. Common Femoral Vein: No evidence of thrombus. Normal compressibility, respiratory phasicity and response to augmentation. Saphenofemoral Junction: No evidence of thrombus. Normal compressibility and flow on color Doppler imaging. Profunda Femoral Vein: No evidence of thrombus. Normal compressibility and flow on color Doppler imaging. Femoral Vein: No evidence of thrombus. Normal compressibility, respiratory phasicity and response to  augmentation. Popliteal Vein: No evidence of thrombus. Normal compressibility, respiratory phasicity and response to augmentation. Calf Veins: No evidence of thrombus. Normal compressibility and flow on color Doppler imaging. Superficial Great Saphenous Vein: No evidence of thrombus. Normal compressibility and flow on color Doppler imaging. Venous Reflux:  None. Other Findings:  None. IMPRESSION: No evidence of deep venous thrombosis. Electronically Signed   By: Roanna Raider M.D.   On: 04/10/2016 01:34    EKG: No orders found for this or any previous visit.  IMPRESSION AND PLAN: 65 year old male patient history of left knee replacement, chronic prostatitis, hypertension, atrial fibrillation presented to the emergency room with left knee swelling and redness and tenderness around the left knee extending onto the anterior part of the left leg. Admitting diagnosis 1. Left knee and left leg cellulitis 2. Chronic atrial fibrillation 3. Hypertension 4. Chronic prostatitis Treatment plan Admit patient to medical floor Start patient on IV vancomycin and IV Zosyn antibiotics. Patient was on anticoagulation with Coumadin as an outpatient, INR is subtherapeutic. Will start patient on increased dose of Coumadin along with full dose of Lovenox subcutaneously until INR is therapeutic. Resume Cardizem for rate control Pain management Follow-up sodium level Supportive care Orthopedic consultation for follow-up.  All the records are reviewed and case discussed with ED provider. Management plans discussed with the patient, family and they are in agreement.  CODE STATUS:FULL Code Status History    This patient does not have a recorded code status. Please follow your organizational policy for patients in this situation.       TOTAL TIME TAKING CARE OF THIS PATIENT: 50 minutes.    Ihor Austin M.D on 04/10/2016 at 2:56 AM  Between 7am to 6pm - Pager - [phone]  After 6pm go to [email or web address]  - password [email or web address]  [facility]  Office  [phone]  CC: Primary care physician; Pcp Not In System

## 2016-04-10 NOTE — NC FL2 (Signed)
[place] MEDICAID FL2 LEVEL OF CARE SCREENING TOOL     IDENTIFICATION  Patient Name: Shane Lamb Birthdate: [birth year] Sex: male Admission Date (Current Location): 04/10/2016  [place] and [place] Number:  Chiropodist and Address:  [facility], [address] [place], [place] [address]      Provider Number: [id number]  Attending Physician Name and Address:  Milagros Loll, MD  Relative Name and Phone Number:       Current Level of Care: Hospital Recommended Level of Care: Skilled Nursing Facility Prior Approval Number:    Date Approved/Denied:   PASRR Number:  ( [id number] )  Discharge Plan: SNF    Current Diagnoses: Patient Active Problem List   Diagnosis Date Noted  . Cellulitis 04/10/2016    Orientation RESPIRATION BLADDER Height & Weight     Self, Time, Situation, Place  Normal Continent Weight: 260 lb (117.935 kg) Height:   (188 cm)  BEHAVIORAL SYMPTOMS/MOOD NEUROLOGICAL BOWEL NUTRITION STATUS   (none )  (none ) Continent Diet (Diet: Heart Healthy )  AMBULATORY STATUS COMMUNICATION OF NEEDS Skin   Extensive Assist Verbally Surgical wounds (Incision: Left Knee )                       Personal Care Assistance Level of Assistance  Bathing, Feeding, Dressing Bathing Assistance: Limited assistance Feeding assistance: Independent Dressing Assistance: Limited assistance     Functional Limitations Info  Sight, Hearing, Speech Sight Info: Adequate Hearing Info: Adequate Speech Info: Adequate    SPECIAL CARE FACTORS FREQUENCY  PT (By licensed PT), OT (By licensed OT)     PT Frequency:  (5) OT Frequency:  (5)            Contractures      Additional Factors Info  Code Status, Allergies Code Status Info:  (Full Code. ) Allergies Info:  (Amiodarone)           Current Medications (04/10/2016):  This is the current hospital active medication list Current Facility-Administered Medications   Medication Dose Route Frequency Provider Last Rate Last Dose  . 0.9 %  sodium chloride infusion  250 mL Intravenous PRN Ihor Austin, MD      . acetaminophen (TYLENOL) tablet 650 mg  650 mg Oral Q6H PRN Ihor Austin, MD      . bisacodyl (DULCOLAX) suppository 10 mg  10 mg Rectal PRN Ihor Austin, MD      . diltiazem (CARDIZEM CD) 24 hr capsule 240 mg  240 mg Oral Daily Ihor Austin, MD   240 mg at 04/10/16 0841  . dofetilide (TIKOSYN) capsule 375 mcg  375 mcg Oral BID Ihor Austin, MD   375 mcg at 04/10/16 0842  . enoxaparin (LOVENOX) injection 120 mg  1 mg/kg Subcutaneous Q12H Pavan Pyreddy, MD   120 mg at 04/10/16 0839  . hydrochlorothiazide (MICROZIDE) capsule 12.5 mg  12.5 mg Oral Daily Ihor Austin, MD   12.5 mg at 04/10/16 0842  . losartan (COZAAR) tablet 100 mg  100 mg Oral Daily Ihor Austin, MD   100 mg at 04/10/16 0840  . magnesium hydroxide (MILK OF MAGNESIA) suspension 30 mL  30 mL Oral Q4H PRN Pavan Pyreddy, MD      . methocarbamol (ROBAXIN) tablet 500 mg  500 mg Oral Q6H PRN Pavan Pyreddy, MD      . metoprolol succinate (TOPROL-XL) 24 hr tablet 25 mg  25 mg Oral BID Ihor Austin, MD   25  mg at 04/10/16 0841  . morphine 2 MG/ML injection 2 mg  2 mg Intravenous Q4H PRN Pavan Pyreddy, MD      . ondansetron (ZOFRAN) tablet 4 mg  4 mg Oral Q6H PRN Ihor AustinPavan Pyreddy, MD       Or  . ondansetron (ZOFRAN) injection 4 mg  4 mg Intravenous Q6H PRN Pavan Pyreddy, MD      . oxyCODONE (Oxy IR/ROXICODONE) immediate release tablet 5 mg  5 mg Oral Q3H PRN Ihor AustinPavan Pyreddy, MD   5 mg at 04/10/16 0843  . piperacillin-tazobactam (ZOSYN) IVPB 3.375 Lamb  3.375 Lamb Intravenous Q8H Pavan Pyreddy, MD      . polyvinyl alcohol (LIQUIFILM TEARS) 1.4 % ophthalmic solution 1 drop  1 drop Both Eyes TID PRN Ihor AustinPavan Pyreddy, MD      . senna-docusate (Senokot-S) tablet 1 tablet  1 tablet Oral BID Ihor AustinPavan Pyreddy, MD   1 tablet at 04/10/16 0901  . sertraline (ZOLOFT) tablet 50 mg  50 mg Oral Daily Ihor AustinPavan Pyreddy, MD   50  mg at 04/10/16 1000  . sodium chloride flush (NS) 0.9 % injection 3 mL  3 mL Intravenous Q12H Ihor AustinPavan Pyreddy, MD   3 mL at 04/10/16 0854  . sodium chloride flush (NS) 0.9 % injection 3 mL  3 mL Intravenous PRN Ihor AustinPavan Pyreddy, MD      . sodium phosphate (FLEET) 7-19 GM/118ML enema 1 enema  1 enema Rectal Q72H PRN Ihor AustinPavan Pyreddy, MD      . traZODone (DESYREL) tablet 100 mg  100 mg Oral QHS Pavan Pyreddy, MD      . vancomycin (VANCOCIN) 1,250 mg in sodium chloride 0.9 % 250 mL IVPB  1,250 mg Intravenous Q8H Pavan Pyreddy, MD   1,250 mg at 04/10/16 0845  . warfarin (COUMADIN) tablet 5 mg  5 mg Oral q1800 Ihor AustinPavan Pyreddy, MD         Discharge Medications: Please see discharge summary for a list of discharge medications.  Relevant Imaging Results:  Relevant Lab Results:   Additional Information  (SSN: [id number])  Shane ProphetMorgan, Shane Keelan Lamb, Shane Lamb

## 2016-04-11 ENCOUNTER — Encounter
Admission: RE | Admit: 2016-04-11 | Discharge: 2016-04-11 | Disposition: A | Discharge status: Home / Self Care | Payer: Medicare Other | Source: Ambulatory Visit | Attending: Internal Medicine | Admitting: Internal Medicine

## 2016-04-11 LAB — BLOOD GAS, VENOUS
ACID-BASE EXCESS: 1.3 mmol/L (ref 0.0–3.0)
BICARBONATE: 26.6 meq/L (ref 21.0–28.0)
O2 SAT: 82.6 %
PATIENT TEMPERATURE: 37
pCO2, Ven: 43 mmHg — ABNORMAL LOW (ref 44.0–60.0)
pH, Ven: 7.4 (ref 7.320–7.430)
pO2, Ven: 47 mmHg — ABNORMAL HIGH (ref 31.0–45.0)

## 2016-04-11 LAB — PROTIME-INR
INR: 1.16
PROTHROMBIN TIME: 15 s (ref 11.4–15.0)

## 2016-04-11 LAB — POTASSIUM: POTASSIUM: 4.3 mmol/L (ref 3.5–5.1)

## 2016-04-11 LAB — VANCOMYCIN, TROUGH: Vancomycin Tr: 21 ug/mL (ref 10–20)

## 2016-04-11 LAB — MAGNESIUM: Magnesium: 2.5 mg/dL — ABNORMAL HIGH (ref 1.7–2.4)

## 2016-04-11 MED ORDER — WARFARIN SODIUM 5 MG PO TABS: 5.0000 mg | ORAL_TABLET | Freq: Once | ORAL | Status: DC

## 2016-04-11 MED ORDER — CEPHALEXIN 500 MG PO CAPS
500.0000 mg | ORAL_CAPSULE | Freq: Three times a day (TID) | ORAL | Status: DC
Start: 2016-04-11 — End: 2022-11-25

## 2016-04-11 MED ORDER — OXYCODONE HCL 5 MG PO CAPS
10.0000 mg | ORAL_CAPSULE | ORAL | Status: DC | PRN
Start: 2016-04-11 — End: 2022-11-25

## 2016-04-11 MED ORDER — WARFARIN SODIUM 4 MG PO TABS: 4.0000 mg | ORAL_TABLET | Freq: Once | ORAL | Status: DC

## 2016-04-11 MED ORDER — PSYLLIUM 95 % PO PACK
1.0000 | PACK | Freq: Once | ORAL | Status: AC
  Administered 2016-04-11: 1 via ORAL
  Filled 2016-04-11: qty 1

## 2016-04-11 NOTE — Progress Notes (Signed)
Pharmacy Antibiotic Note  Shane Lamb is a 65 y.o. male admitted on 04/10/2016 with cellulitis.  Pharmacy has been consulted for vancomycin and Zosyn dosing.  Plan: DW 96kg  Vd 67L kei 0.091 hr-1  1/2 8 hours Vancomycin 1250 mg q 8 hours ordered. Not candidate for stacked dosing. Level before 5th dose. Goal trough 15-20.  Zosyn 3.375 grams q 8 hours ordered  Height: 6\' 2"  (188 cm) Weight: 260 lb (117.935 kg) IBW/kg (Calculated) : 82.2  Temp (24hrs), Avg:99.1 F (37.3 C), Min:97.6 F (36.4 C), Max:99.6 F (37.6 C)   Recent Labs Lab 04/10/16 0020 04/10/16 0559  WBC 8.7 8.3  CREATININE 0.95 0.94    Estimated Creatinine Clearance: 105.5 mL/min (by C-G formula based on Cr of 0.94).    Allergies  Allergen Reactions  . Amiodarone Nausea And Vomiting    Antimicrobials this admission: vancomycin  >>  Zosyn  >>   Dose adjustments this admission:  6/1 AM vanc level 21. Drawn ~1.5 hours early. Will continue current regimen and recheck level before next dose.   Microbiology results: 5/31 BCx: pending   Thank you for allowing pharmacy to be a part of this patient's care.  Yoselin Amerman S 04/11/2016 6:27 AM

## 2016-04-11 NOTE — Clinical Social Work Note (Signed)
Pt is ready for discharge today and [facility]   CSW presented bed offers, and [facility] Facility was able to pick up pt's belongings. Pt updated his friend. RN called report and [facility].   CSW is signing off as no further needs identified.   Dede QuerySarah Tavi Gaughran, MSW, LCSW  Clinical Social Worker  [phone]

## 2016-04-11 NOTE — Discharge Summary (Signed)
[facility] [place] at [facility]   PATIENT NAME: Shane CzechLarry Lamb    MR#:  [id number]  DATE OF BIRTH:  April 21, 1951  DATE OF ADMISSION:  04/10/2016 ADMITTING PHYSICIAN: Ihor AustinPavan Pyreddy, MD  DATE OF DISCHARGE: No discharge date for patient encounter.  PRIMARY CARE PHYSICIAN: Pcp Not In System   ADMISSION DIAGNOSIS:  Left leg cellulitis [L03.116]  DISCHARGE DIAGNOSIS:  Principal Problem:   Cellulitis   SECONDARY DIAGNOSIS:   Past Medical History  Diagnosis Date  . Hypertension   . Atrial fibrillation (HCC)   . Prostatitis, chronic      ADMITTING HISTORY   HISTORY OF PRESENT ILLNESS: Shane Lamb is a 65 y.o. male with a known history of Atrial fibrillation, hypertension, chronic prostatitis, left knee replacement a week ago for osteoarthritis was [facility] for swelling and pain and tenderness in the left knee and redness around the left knee and extending onto the left leg. The pain is aching in nature 5 out of 10 on a scale of 1-10. Patient is currently in rehabilitation after he was discharged from the hospital after he had a knee replacement. No history of chest pain no history of shortness of breath. Had low-grade fever but no chills. No history of fall. No history of any abdominal pain nausea vomiting diarrhea. No history of any GI bleed. Hospitalist service was consulted for further care of the patient for cellulitis of the left knee and left leg. Patient was worked up with a Doppler venous ultrasound of the left lower extremity in the emergency room which showed no DVT. Patient received IV vancomycin and IV Zosyn antibiotics in the emergency room.  HOSPITAL COURSE:   65 year old male patient history of left knee replacement, chronic prostatitis, hypertension, atrial fibrillation presented to the emergency room with left knee swelling and redness and tenderness around the left knee extending onto the anterior part of  the left leg. Admitting diagnosis 1. Left knee and left leg cellulitis - Improving 2. Chronic atrial fibrillation - On Tikosyn, metoprolol and Coumadin 3. Hypertension 4. Chronic prostatitis  Seen by orthopedics. Treated with Vancomycin and Zosyn. Iproved well. Cx negative. Will treat with Keflex for 1 more week.  F/u Dr. Jena GaussKernodle-Orthopedics - 1 week.  Activity as before.  CONSULTS OBTAINED:  Treatment Team:  Erin SonsHarold Kernodle, MD  DRUG ALLERGIES:   Allergies  Allergen Reactions  . Amiodarone Nausea And Vomiting    DISCHARGE MEDICATIONS:   Current Discharge Medication List    CONTINUE these medications which have CHANGED   Details  cephALEXin (KEFLEX) 500 MG capsule Take 1 capsule (500 mg total) by mouth 3 (three) times daily. Qty: 21 capsule, Refills: 0    oxycodone (OXY-IR) 5 MG capsule Take 2 capsules (10 mg total) by mouth every 4 (four) hours as needed. Qty: 30 capsule, Refills: 0    warfarin (COUMADIN) 4 MG tablet Take 1 tablet (4 mg total) by mouth one time only at 6 PM.      CONTINUE these medications which have NOT CHANGED   Details  acetaminophen (TYLENOL) 325 MG tablet Take 650 mg by mouth every 6 (six) hours as needed.    bisacodyl (DULCOLAX) 10 MG suppository Place 10 mg rectally as needed for moderate constipation.    bisacodyl (FLEET) 10 MG/30ML ENEM Place 10 mg rectally See admin instructions. Every 72 hours prn    carboxymethylcellulose (REFRESH PLUS) 0.5 % SOLN Place 1 drop into both eyes 3 (three) times daily as needed.  diltiazem (DILACOR XR) 240 MG 24 hr capsule Take 240 mg by mouth daily.    dofetilide (TIKOSYN) 125 MCG capsule Take 375 mcg by mouth 2 (two) times daily.    hydrochlorothiazide (MICROZIDE) 12.5 MG capsule Take 12.5 mg by mouth daily.    losartan (COZAAR) 100 MG tablet Take 100 mg by mouth daily.    magnesium hydroxide (MILK OF MAGNESIA) 400 MG/5ML suspension Take 30 mLs by mouth every 4 (four) hours as needed for mild  constipation.    methocarbamol (ROBAXIN) 500 MG tablet Take 500 mg by mouth every 6 (six) hours as needed for muscle spasms.    metoprolol succinate (TOPROL-XL) 25 MG 24 hr tablet Take 25 mg by mouth 2 (two) times daily.    senna-docusate (SENOKOT-S) 8.6-50 MG tablet Take 1 tablet by mouth 2 (two) times daily.    sertraline (ZOLOFT) 50 MG tablet Take 50 mg by mouth daily.    sildenafil (VIAGRA) 100 MG tablet Take 100 mg by mouth daily as needed for erectile dysfunction.    traZODone (DESYREL) 100 MG tablet Take 100 mg by mouth at bedtime.        Today   VITAL SIGNS:  Blood pressure 146/80, pulse 72, temperature 98.9 F (37.2 C), temperature source Oral, resp. rate 18, height  (1.88 m), weight 117.935 kg (260 lb), SpO2 98 %.  I/O:   Intake/Output Summary (Last 24 hours) at 04/11/16 0955 Last data filed at 04/11/16 0800  Gross per 24 hour  Intake   1370 ml  Output   1450 ml  Net    -80 ml    PHYSICAL EXAMINATION:  Physical Exam  GENERAL:  65 y.o.-year-old patient lying in the bed with no acute distress.  LUNGS: Normal breath sounds bilaterally, no wheezing, rales,rhonchi or crepitation. No use of accessory muscles of respiration.  CARDIOVASCULAR: S1, S2 normal. No murmurs, rubs, or gallops.  ABDOMEN: Soft, non-tender, non-distended. Bowel sounds present. No organomegaly or mass.  NEUROLOGIC: Moves all 4 extremities. PSYCHIATRIC: The patient is alert and oriented x 3.  SKIN: Surgical scar left knee is less red. No discharge.  DATA REVIEW:   CBC  Recent Labs Lab 04/10/16 0559  WBC 8.3  HGB 13.2  HCT 38.3*  PLT 266    Chemistries   Recent Labs Lab 04/10/16 0020 04/10/16 0559 04/11/16 0541  NA 134* 139  --   K 4.4 4.4 4.3  CL 98* 103  --   CO2 27 29  --   GLUCOSE 110* 99  --   BUN 15 13  --   CREATININE 0.95 0.94  --   CALCIUM 8.9 9.0  --   MG  --   --  2.5*  AST 28  --   --   ALT 25  --   --   ALKPHOS 59  --   --   BILITOT 0.4  --   --      Cardiac Enzymes No results for input(s): TROPONINI in the last 168 hours.  Microbiology Results  Results for orders placed or performed during the hospital encounter of 04/10/16  Blood culture (routine x 2)     Status: None (Preliminary result)   Collection Time: 04/10/16 12:19 AM  Result Value Ref Range Status   Specimen Description BLOOD RIGHT WRIST  Final   Special Requests BOTTLES DRAWN AEROBIC AND ANAEROBIC  Final   Culture NO GROWTH < 12 HOURS  Final   Report Status PENDING  Incomplete  Blood culture (routine x 2)     Status: None (Preliminary result)   Collection Time: 04/10/16 12:24 AM  Result Value Ref Range Status   Specimen Description BLOOD RIGHT ARM  Final   Special Requests BOTTLES DRAWN AEROBIC AND ANAEROBIC  Final   Culture NO GROWTH < 12 HOURS  Final   Report Status PENDING  Incomplete  MRSA PCR Screening     Status: None   Collection Time: 04/10/16  2:55 PM  Result Value Ref Range Status   MRSA by PCR NEGATIVE NEGATIVE Final    Comment:        The GeneXpert MRSA Assay (FDA approved for NASAL specimens only), is one component of a comprehensive MRSA colonization surveillance program. It is not intended to diagnose MRSA infection nor to guide or monitor treatment for MRSA infections.     RADIOLOGY:  US Venous Img Lower Unilateral Left  04/10/2016  CLINICAL DATA:  Acute onset of left leg pain and swelling, status post left knee replacement. Initial encounter. EXAM: LEFT LOWER EXTREMITY VENOUS DOPPLER ULTRASOUND TECHNIQUE: Gray-scale sonography with graded compression, as well as color Doppler and duplex ultrasound were performed to evaluate the lower extremity deep venous systems from the level of the common femoral vein and including the common femoral, femoral, profunda femoral, popliteal and calf veins including the posterior tibial, peroneal and gastrocnemius veins when visible. The superficial great saphenous vein was also interrogated.  Spectral Doppler was utilized to evaluate flow at rest and with distal augmentation maneuvers in the common femoral, femoral and popliteal veins. COMPARISON:  Left knee MRI performed 02/16/2016, and left knee CT performed 03/21/2016 FINDINGS: Contralateral Common Femoral Vein: Respiratory phasicity is normal and symmetric with the symptomatic side. No evidence of thrombus. Normal compressibility. Common Femoral Vein: No evidence of thrombus. Normal compressibility, respiratory phasicity and response to augmentation. Saphenofemoral Junction: No evidence of thrombus. Normal compressibility and flow on color Doppler imaging. Profunda Femoral Vein: No evidence of thrombus. Normal compressibility and flow on color Doppler imaging. Femoral Vein: No evidence of thrombus. Normal compressibility, respiratory phasicity and response to augmentation. Popliteal Vein: No evidence of thrombus. Normal compressibility, respiratory phasicity and response to augmentation. Calf Veins: No evidence of thrombus. Normal compressibility and flow on color Doppler imaging. Superficial Great Saphenous Vein: No evidence of thrombus. Normal compressibility and flow on color Doppler imaging. Venous Reflux:  None. Other Findings:  None. IMPRESSION: No evidence of deep venous thrombosis. Electronically Signed   By: Roanna Raider M.D.   On: 04/10/2016 01:34    Follow up with PCP in 1 week.  Management plans discussed with the patient, family and they are in agreement.  CODE STATUS:     Code Status Orders        Start     Ordered   04/10/16 0350  Full code   Continuous     04/10/16 0349    Code Status History    Date Active Date Inactive Code Status Order ID Comments User Context   This patient has a current code status but no historical code status.      TOTAL TIME TAKING CARE OF THIS PATIENT ON DAY OF DISCHARGE: more than 30 minutes.   Milagros Loll R M.D on 04/11/2016 at 9:55 AM  Between 7am to 6pm - Pager -  [phone]  After 6pm go to [email or web address] - password [email or web address]  [facility]  Office  [phone]  CC: Primary care physician; Pcp Not In System  Note:  This dictation was prepared with Dragon dictation along with smaller phrase technology. Any transcriptional errors that result from this process are unintentional.

## 2016-04-11 NOTE — Clinical Social Work Placement (Signed)
   CLINICAL SOCIAL WORK PLACEMENT  NOTE  Date:  04/11/2016  Patient Details  Name: Shane RoanLarry D Lamb MRN: [id number] Date of Birth: July 26, 1951  Clinical Social Work is seeking post-discharge placement for this patient at the Skilled  Nursing Facility level of care (*CSW will initial, date and re-position this form in  chart as items are completed):  Yes   Patient/family provided with [place] Clinical Social Work Department's list of facilities offering this level of care within the geographic area requested by the patient (or if unable, by the patient's family).  Yes   Patient/family informed of their freedom to choose among providers that offer the needed level of care, that participate in Medicare, Medicaid or managed care program needed by the patient, have an available bed and are willing to accept the patient.  Yes   Patient/family informed of [place]'s ownership interest in [facility] and [facility], as well as of the fact that they are under no obligation to receive care at these facilities.  PASRR submitted to EDS on       PASRR number received on       Existing PASRR number confirmed on 04/10/16     FL2 transmitted to all facilities in geographic area requested by pt/family on 04/10/16     FL2 transmitted to all facilities within larger geographic area on       Patient informed that his/her managed care company has contracts with or will negotiate with certain facilities, including the following:        Yes   Patient/family informed of bed offers received.  [facility] [place]     Physician recommends and patient chooses bed at  [facility](S[facility])    [facility]  on 04/11/16[facility]  Patient to be transferred to facility [facility]     Patient family notified on 04/11/16 of transfer.  Name of family member notified:  Pt declined     PHYSICIAN       Additional Comment:     _______________________________________________ Dede QuerySarah Doyt Castellana, LCSW 04/11/2016, 2:16 PM

## 2016-04-11 NOTE — Progress Notes (Signed)
  Subjective:     Patient reports pain as mild.   Patient seen in rounds with Dr. Gavin PottersKernodle. Patient is well, and has had no acute complaints or problems.  Improving with swelling and pain. Plan is to go Rehab after hospital stay. Negative for chest pain and shortness of breath Fever: no Gastrointestinal:negative for nausea and vomiting  Objective: Vital signs in last 24 hours: Temp:  [97.6 F (36.4 C)-99.6 F (37.6 C)] 97.6 F (36.4 C) (06/01 0511) Pulse Rate:  [62-76] 62 (06/01 0511) Resp:  [16-22] 22 (06/01 0511) BP: (96-154)/(31-80) 125/76 mmHg (06/01 0511) SpO2:  [94 %-98 %] 96 % (06/01 0511)  Intake/Output from previous day:  Intake/Output Summary (Last 24 hours) at 04/11/16 0729 Last data filed at 04/11/16 0559  Gross per 24 hour  Intake   1420 ml  Output   1150 ml  Net    270 ml    Intake/Output this shift:    Labs:  Recent Labs  04/10/16 0020 04/10/16 0559  HGB 13.5 13.2    Recent Labs  04/10/16 0020 04/10/16 0559  WBC 8.7 8.3  RBC 4.32* 4.16*  HCT 39.6* 38.3*  PLT 287 266    Recent Labs  04/10/16 0020 04/10/16 0559 04/11/16 0541  NA 134* 139  --   K 4.4 4.4 4.3  CL 98* 103  --   CO2 27 29  --   BUN 15 13  --   CREATININE 0.95 0.94  --   GLUCOSE 110* 99  --   CALCIUM 8.9 9.0  --     Recent Labs  04/10/16 0559 04/11/16 0541  INR 1.15 1.16     EXAM General - Patient is Alert and Oriented Extremity - Sensation intact distally Intact pulses distally No cellulitis present Compartment soft.  Mild effusion, as expected.  Erythema diminished, with no signs of active Cellulitis. Dressing/Incision - clean, dry, no drainage Motor Function - intact, moving foot and toes well on exam.   Past Medical History  Diagnosis Date  . Hypertension   . Atrial fibrillation (HCC)   . Prostatitis, chronic     Assessment/Plan:     Principal Problem:   Cellulitis  Estimated body mass index is 33.37 kg/(m^2) as calculated from the  following:   Height as of this encounter: 6\' 2"  (1.88 m).   Weight as of this encounter: 117.935 kg (260 lb). Up with therapy Discharge to SNF, when cleared medically.  Cellulitis resolved with Abx.  Feel like patient is ready to go back to a Rehab facility.    DVT Prophylaxis - Lovenox and Coumadin, since not therapeutic Weight-Bearing as tolerated to left leg  Dedra Skeensodd Brette Cast, PA-C Orthopaedic Surgery 04/11/2016, 7:29 AM

## 2016-04-11 NOTE — Evaluation (Signed)
Physical Therapy Evaluation Patient Details Name: Shane Lamb MRN: [id number] DOB: [birth year] Today's Date: 04/11/2016   History of Present Illness  Pt admited for cellulitis following L LE partial knee replacement 1 week ago. Pt with complaints of pain and swelling x 2 days. [facility]. Plans to dc back this date.  Clinical Impression  Pt is a pleasant 65 year old male who was admitted for L LE cellulitis post partial knee replacement. Pt performs transfers and ambulation with cga and SW. Pt demonstrates deficits with strength/pain/mobility. Pt limited secondary to pain. Pt able to perform 10 SLRs therefore does not need KI at this time. Would benefit from skilled PT to address above deficits and promote optimal return to PLOF.      Follow Up Recommendations SNF    Equipment Recommendations       Recommendations for Other Services       Precautions / Restrictions Precautions Precautions: Fall;Knee Precaution Booklet Issued: Yes (comment) Restrictions Weight Bearing Restrictions: Yes LLE Weight Bearing: Weight bearing as tolerated      Mobility  Bed Mobility               General bed mobility comments: not performed as pt received seated at EOB  Transfers Overall transfer level: Needs assistance Equipment used: Standard walker Transfers: Sit to/from Stand           General transfer comment: Pt performed transfer from elevated surface. Required cga for completion of transfer. Once standing, pt able to stand with supervision. Safe technique performed  Ambulation/Gait Ambulation/Gait assistance: Min guard Ambulation Distance (Feet): 5 Feet Assistive device: Standard walker Gait Pattern/deviations: Step-to pattern     General Gait Details: ambulated to recliner secondary to pain. Pt with safe technique with step to gait pattern. Pt educated on WB status during mobility.  Stairs            Wheelchair Mobility    Modified Rankin  (Stroke Patients Only)       Balance Overall balance assessment: Needs assistance Sitting-balance support: Feet supported Sitting balance-Leahy Scale: Normal     Standing balance support: Bilateral upper extremity supported Standing balance-Leahy Scale: Good                               Pertinent Vitals/Pain Pain Assessment: 0-10 Pain Score: 3  Pain Location: L knee Pain Descriptors / Indicators: Operative site guarding Pain Intervention(s): Limited activity within patient's tolerance;Ice applied    Home Living Family/patient expects to be discharged to:: Skilled nursing facility                      Prior Function Level of Independence: Needs assistance         Comments: Pt reports ambulating with rehab staff using RW     Hand Dominance        Extremity/Trunk Assessment   Upper Extremity Assessment: Overall WFL for tasks assessed           Lower Extremity Assessment: LLE deficits/detail (grossly 3+/5, able to perform 10 SLRs)         Communication   Communication: No difficulties  Cognition Arousal/Alertness: Awake/alert Behavior During Therapy: Restless;Impulsive Overall Cognitive Status: Within Functional Limits for tasks assessed                      General Comments      Exercises Total  Joint Exercises Goniometric ROM: L LE AAROM: 3-60 degrees Other Exercises Other Exercises: Pt performed seated ther-ex including ankle pumps, quad sets, SLRs, hip abd/add, and seated knee flexion stretches. All ther-ex performed x 10 reps with cga      Assessment/Plan    PT Assessment Patient needs continued PT services  PT Diagnosis Difficulty walking;Generalized weakness;Acute pain   PT Problem List Decreased strength;Decreased range of motion;Decreased mobility;Pain  PT Treatment Interventions DME instruction;Gait training;Therapeutic exercise   PT Goals (Current goals can be found in the Care Plan section) Acute Rehab  PT Goals Patient Stated Goal: to get stronger PT Goal Formulation: With patient Time For Goal Achievement: 04/25/16 Potential to Achieve Goals: Good    Frequency 7X/week   Barriers to discharge        Co-evaluation               End of Session Equipment Utilized During Treatment: Gait belt Activity Tolerance: Patient limited by pain Patient left: in chair;with chair alarm set Nurse Communication: Mobility status         Time: 1610-9604 PT Time Calculation (min) (ACUTE ONLY): 26 min   Charges:   PT Evaluation $PT Eval Moderate Complexity: 1 Procedure PT Treatments $Therapeutic Exercise: 8-22 mins   PT G Codes:        [name] 04-25-16, 1:06 PM  [name], PT, DPT [phone]

## 2016-04-11 NOTE — Discharge Instructions (Signed)
°  DIET:  °Cardiac diet ° °DISCHARGE CONDITION:  °Stable ° °ACTIVITY:  °Activity as tolerated ° °OXYGEN:  °Home Oxygen: No. °  °Oxygen Delivery: room air ° °DISCHARGE LOCATION:  °nursing home  ° °If you experience worsening of your admission symptoms, develop shortness of breath, life threatening emergency, suicidal or homicidal thoughts you must seek medical attention immediately by calling 911 or calling your MD immediately  if symptoms less severe. ° °You Must read complete instructions/literature along with all the possible adverse reactions/side effects for all the Medicines you take and that have been prescribed to you. Take any new Medicines after you have completely understood and accpet all the possible adverse reactions/side effects.  ° °Please note ° °You were cared for by a hospitalist during your hospital stay. If you have any questions about your discharge medications or the care you received while you were in the hospital after you are discharged, you can call the unit and asked to speak with the hospitalist on call if the hospitalist that took care of you is not available. Once you are discharged, your primary care physician will handle any further medical issues. Please note that NO REFILLS for any discharge medications will be authorized once you are discharged, as it is imperative that you return to your primary care physician (or establish a relationship with a primary care physician if you do not have one) for your aftercare needs so that they can reassess your need for medications and monitor your lab values. ° ° ° °

## 2016-04-11 NOTE — Progress Notes (Signed)
PT DISCHARGED TO [facility] VIA EMS. REPORT CALLED  TO [facility]. SPARE HONEYCOMB DRESSING  SENT TO FACILITY. [facility] RETRIEVED VALUABLES  PRIOR TO DISCHARGE AND MEDS SENT  WITH PT

## 2016-04-11 NOTE — Progress Notes (Signed)
ANTICOAGULATION CONSULT NOTE - Initial Consult  Pharmacy Consult for warfarin Indication: atrial fibrillation  Allergies  Allergen Reactions  . Amiodarone Nausea And Vomiting   Patient Measurements: Height: 6\' 2"  (188 cm) Weight: 260 lb (117.935 kg) IBW/kg (Calculated) : 82.2  Vital Signs: Temp: 98.9 F (37.2 C) (06/01 0745) Temp Source: Oral (06/01 0745) BP: 146/80 mmHg (06/01 0745) Pulse Rate: 72 (06/01 0745)  Labs:  Recent Labs  04/10/16 0020 04/10/16 0559 04/11/16 0541  HGB 13.5 13.2  --   HCT 39.6* 38.3*  --   PLT 287 266  --   LABPROT 14.4 14.9 15.0  INR 1.10 1.15 1.16  CREATININE 0.95 0.94  --    Estimated Creatinine Clearance: 105.5 mL/min (by C-G formula based on Cr of 0.94).  Medical History: Past Medical History  Diagnosis Date  . Hypertension   . Atrial fibrillation (HCC)   . Prostatitis, chronic    Medications:  Scheduled:  . diltiazem  240 mg Oral Daily  . dofetilide  375 mcg Oral BID  . hydrochlorothiazide  12.5 mg Oral Daily  . losartan  100 mg Oral Daily  . metoprolol succinate  25 mg Oral BID  . piperacillin-tazobactam (ZOSYN)  IV  3.375 g Intravenous Q8H  . senna-docusate  1 tablet Oral BID  . sertraline  50 mg Oral Daily  . sodium chloride flush  3 mL Intravenous Q12H  . traZODone  100 mg Oral QHS  . vancomycin  1,250 mg Intravenous Q8H  . warfarin  5 mg Oral ONCE-1800  . Warfarin - Pharmacist Dosing Inpatient   Does not apply q1800    Assessment: Pharmacy consulted to dose and monitor warfarin in this 65 year old male who was taking warfarin prior to admission for atrial fibrillation. Patient was prescribed 3 mg PO daily PTA and appears to be a relatively new start.   INR of 1.16 remains subtherapeutic, which is expected as warfarin was resumed yesterday and it will take ~3 days to see full effects.  Dosing history: Date INR Dose 5/31 1.15 5 mg 6/1 1.16 6 mg  Goal of Therapy:  INR 2-3 Monitor platelets by anticoagulation  protocol: Yes   Plan:  Will order warfarin 5 mg PO x 1 dose tonight @ 1800 INR will be monitored daily  Thank you for allowing pharmacy to be involved in this patient's care.   Cindi CarbonMary M Hazell Siwik, PharmD Clinical Pharmacist 04/11/2016,8:54 AM

## 2016-04-15 LAB — CULTURE, BLOOD (ROUTINE X 2)
CULTURE: NO GROWTH
Culture: NO GROWTH

## 2016-05-02 DIAGNOSIS — Z96652 Presence of left artificial knee joint: Secondary | ICD-10-CM | POA: Insufficient documentation

## 2016-06-29 DIAGNOSIS — Z8679 Personal history of other diseases of the circulatory system: Secondary | ICD-10-CM | POA: Insufficient documentation

## 2016-07-01 DIAGNOSIS — G8929 Other chronic pain: Secondary | ICD-10-CM | POA: Insufficient documentation

## 2016-07-01 DIAGNOSIS — M25562 Pain in left knee: Secondary | ICD-10-CM | POA: Insufficient documentation

## 2016-09-17 DIAGNOSIS — Z79899 Other long term (current) drug therapy: Secondary | ICD-10-CM | POA: Insufficient documentation

## 2016-12-12 DIAGNOSIS — Z8679 Personal history of other diseases of the circulatory system: Secondary | ICD-10-CM | POA: Insufficient documentation

## 2017-04-14 DIAGNOSIS — Z8679 Personal history of other diseases of the circulatory system: Secondary | ICD-10-CM | POA: Insufficient documentation

## 2017-04-14 DIAGNOSIS — E669 Obesity, unspecified: Secondary | ICD-10-CM | POA: Insufficient documentation

## 2017-04-14 DIAGNOSIS — E876 Hypokalemia: Secondary | ICD-10-CM | POA: Insufficient documentation

## 2017-04-14 DIAGNOSIS — R7303 Prediabetes: Secondary | ICD-10-CM | POA: Insufficient documentation

## 2017-04-15 DIAGNOSIS — I472 Ventricular tachycardia: Secondary | ICD-10-CM | POA: Insufficient documentation

## 2017-04-15 DIAGNOSIS — I2510 Atherosclerotic heart disease of native coronary artery without angina pectoris: Secondary | ICD-10-CM | POA: Insufficient documentation

## 2017-04-15 DIAGNOSIS — R000 Tachycardia, unspecified: Secondary | ICD-10-CM | POA: Insufficient documentation

## 2017-06-10 DIAGNOSIS — G8929 Other chronic pain: Secondary | ICD-10-CM | POA: Insufficient documentation

## 2017-06-10 DIAGNOSIS — G894 Chronic pain syndrome: Secondary | ICD-10-CM | POA: Insufficient documentation

## 2017-06-10 DIAGNOSIS — M47816 Spondylosis without myelopathy or radiculopathy, lumbar region: Secondary | ICD-10-CM | POA: Insufficient documentation

## 2017-06-10 DIAGNOSIS — M160 Bilateral primary osteoarthritis of hip: Secondary | ICD-10-CM | POA: Insufficient documentation

## 2017-06-10 DIAGNOSIS — M1990 Unspecified osteoarthritis, unspecified site: Secondary | ICD-10-CM | POA: Insufficient documentation

## 2018-02-06 ENCOUNTER — Ambulatory Visit: Payer: Medicare Other | Attending: Internal Medicine

## 2018-02-06 DIAGNOSIS — R0683 Snoring: Secondary | ICD-10-CM | POA: Diagnosis present

## 2018-02-06 DIAGNOSIS — G4733 Obstructive sleep apnea (adult) (pediatric): Secondary | ICD-10-CM | POA: Diagnosis not present

## 2018-02-06 DIAGNOSIS — E669 Obesity, unspecified: Secondary | ICD-10-CM | POA: Insufficient documentation

## 2018-02-06 DIAGNOSIS — G4761 Periodic limb movement disorder: Secondary | ICD-10-CM | POA: Insufficient documentation

## 2018-02-06 DIAGNOSIS — I10 Essential (primary) hypertension: Secondary | ICD-10-CM | POA: Diagnosis present

## 2018-02-25 ENCOUNTER — Ambulatory Visit: Payer: Medicare Other | Attending: Internal Medicine

## 2018-02-25 DIAGNOSIS — G4733 Obstructive sleep apnea (adult) (pediatric): Secondary | ICD-10-CM | POA: Insufficient documentation

## 2018-02-25 DIAGNOSIS — G4761 Periodic limb movement disorder: Secondary | ICD-10-CM | POA: Diagnosis not present

## 2018-05-25 ENCOUNTER — Ambulatory Visit
Admission: RE | Admit: 2018-05-25 | Discharge: 2018-05-25 | Disposition: A | Discharge status: Home / Self Care | Payer: Medicare Other | Source: Ambulatory Visit | Attending: Internal Medicine | Admitting: Internal Medicine

## 2018-05-25 ENCOUNTER — Other Ambulatory Visit: Payer: Self-pay | Admitting: Internal Medicine

## 2018-05-25 DIAGNOSIS — R600 Localized edema: Secondary | ICD-10-CM

## 2018-05-25 DIAGNOSIS — I480 Paroxysmal atrial fibrillation: Secondary | ICD-10-CM

## 2018-05-25 DIAGNOSIS — Z8679 Personal history of other diseases of the circulatory system: Secondary | ICD-10-CM | POA: Insufficient documentation

## 2018-05-25 DIAGNOSIS — I10 Essential (primary) hypertension: Secondary | ICD-10-CM | POA: Insufficient documentation

## 2018-05-25 DIAGNOSIS — G4733 Obstructive sleep apnea (adult) (pediatric): Secondary | ICD-10-CM | POA: Diagnosis not present

## 2018-05-28 DIAGNOSIS — Z7901 Long term (current) use of anticoagulants: Secondary | ICD-10-CM | POA: Insufficient documentation

## 2018-06-23 ENCOUNTER — Other Ambulatory Visit: Payer: Self-pay

## 2018-06-23 ENCOUNTER — Emergency Department
Admission: EM | Admit: 2018-06-23 | Discharge: 2018-06-23 | Disposition: A | Discharge status: Home / Self Care | Payer: Medicare Other | Attending: Emergency Medicine | Admitting: Emergency Medicine

## 2018-06-23 ENCOUNTER — Emergency Department: Payer: Medicare Other

## 2018-06-23 DIAGNOSIS — W19XXXA Unspecified fall, initial encounter: Secondary | ICD-10-CM

## 2018-06-23 DIAGNOSIS — Y9355 Activity, bike riding: Secondary | ICD-10-CM | POA: Diagnosis not present

## 2018-06-23 DIAGNOSIS — Z23 Encounter for immunization: Secondary | ICD-10-CM | POA: Insufficient documentation

## 2018-06-23 DIAGNOSIS — S0990XA Unspecified injury of head, initial encounter: Secondary | ICD-10-CM | POA: Diagnosis present

## 2018-06-23 DIAGNOSIS — Y998 Other external cause status: Secondary | ICD-10-CM | POA: Insufficient documentation

## 2018-06-23 DIAGNOSIS — I4891 Unspecified atrial fibrillation: Secondary | ICD-10-CM | POA: Diagnosis not present

## 2018-06-23 DIAGNOSIS — S0121XA Laceration without foreign body of nose, initial encounter: Secondary | ICD-10-CM | POA: Diagnosis not present

## 2018-06-23 DIAGNOSIS — I10 Essential (primary) hypertension: Secondary | ICD-10-CM | POA: Insufficient documentation

## 2018-06-23 DIAGNOSIS — R000 Tachycardia, unspecified: Secondary | ICD-10-CM | POA: Insufficient documentation

## 2018-06-23 DIAGNOSIS — Z79899 Other long term (current) drug therapy: Secondary | ICD-10-CM | POA: Diagnosis not present

## 2018-06-23 DIAGNOSIS — Z7901 Long term (current) use of anticoagulants: Secondary | ICD-10-CM | POA: Diagnosis not present

## 2018-06-23 DIAGNOSIS — Y929 Unspecified place or not applicable: Secondary | ICD-10-CM | POA: Insufficient documentation

## 2018-06-23 DIAGNOSIS — Z96659 Presence of unspecified artificial knee joint: Secondary | ICD-10-CM | POA: Insufficient documentation

## 2018-06-23 LAB — BASIC METABOLIC PANEL
Anion gap: 8 (ref 5–15)
BUN: 9 mg/dL (ref 8–23)
CALCIUM: 9.1 mg/dL (ref 8.9–10.3)
CO2: 23 mmol/L (ref 22–32)
CREATININE: 1.03 mg/dL (ref 0.61–1.24)
Chloride: 108 mmol/L (ref 98–111)
GFR calc Af Amer: >60 mL/min (ref >60)
GLUCOSE: 115 mg/dL — AB (ref 70–99)
Potassium: 4.3 mmol/L (ref 3.5–5.1)
Sodium: 139 mmol/L (ref 135–145)

## 2018-06-23 LAB — CBC
HCT: 45.1 % (ref 40.0–52.0)
Hemoglobin: 15.7 g/dL (ref 13.0–18.0)
MCH: 31.6 pg (ref 26.0–34.0)
MCHC: 34.9 g/dL (ref 32.0–36.0)
MCV: 90.6 fL (ref 80.0–100.0)
PLATELETS: 212 10*3/uL (ref 150–440)
RBC: 4.98 MIL/uL (ref 4.40–5.90)
RDW: 15.7 % — AB (ref 11.5–14.5)
WBC: 9.6 10*3/uL (ref 3.8–10.6)

## 2018-06-23 LAB — TROPONIN I

## 2018-06-23 LAB — BRAIN NATRIURETIC PEPTIDE: B Natriuretic Peptide: 16 pg/mL (ref 0.0–100.0)

## 2018-06-23 MED ORDER — DILTIAZEM HCL 60 MG PO TABS
30.0000 mg | ORAL_TABLET | Freq: Once | ORAL | Status: AC
  Administered 2018-06-23: 30 mg via ORAL
  Filled 2018-06-23: qty 1

## 2018-06-23 MED ORDER — ACETAMINOPHEN 500 MG PO TABS
1000.0000 mg | ORAL_TABLET | Freq: Once | ORAL | Status: AC
  Administered 2018-06-23: 1000 mg via ORAL

## 2018-06-23 MED ORDER — TETANUS-DIPHTH-ACELL PERTUSSIS 5-2.5-18.5 LF-MCG/0.5 IM SUSP
0.5000 mL | Freq: Once | INTRAMUSCULAR | Status: AC
  Administered 2018-06-23: 0.5 mL via INTRAMUSCULAR
  Filled 2018-06-23: qty 0.5

## 2018-06-23 MED ORDER — ACETAMINOPHEN 500 MG PO TABS
ORAL_TABLET | ORAL | Status: AC
  Filled 2018-06-23: qty 2
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
## 2018-06-23 NOTE — ED Notes (Addendum)
Pt in NAD. Bleeding under control with skin glue. Meal tray provided.
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
## 2020-02-18 IMAGING — US US EXTREM LOW VENOUS BILAT
1 series · 13 of 24 positions shown · non-contrast
Comparison: Left lower extremity venous Doppler ultrasound -
04/10/2016

CLINICAL DATA: Bilateral lower extremity edema. History of atrial
fibrillation. Evaluate for DVT.



[Series 1: us extrem low venous bilat · 13 of 59 slices shown]
[im 1/59]
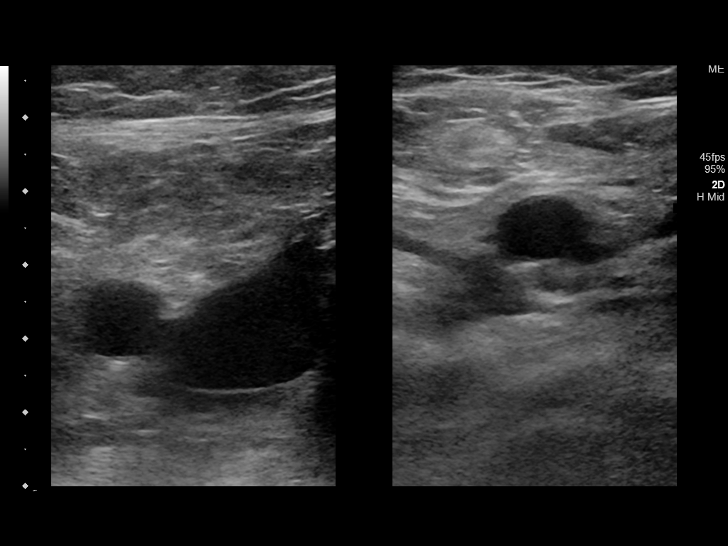
[im 6/59]
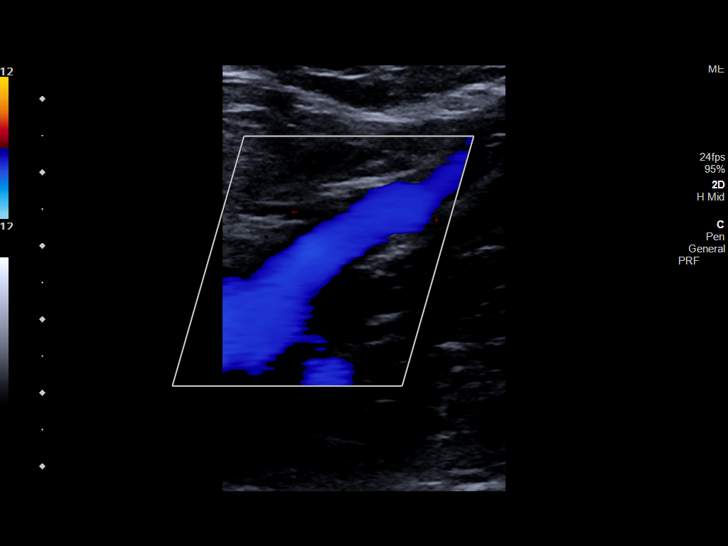
[im 11/59]
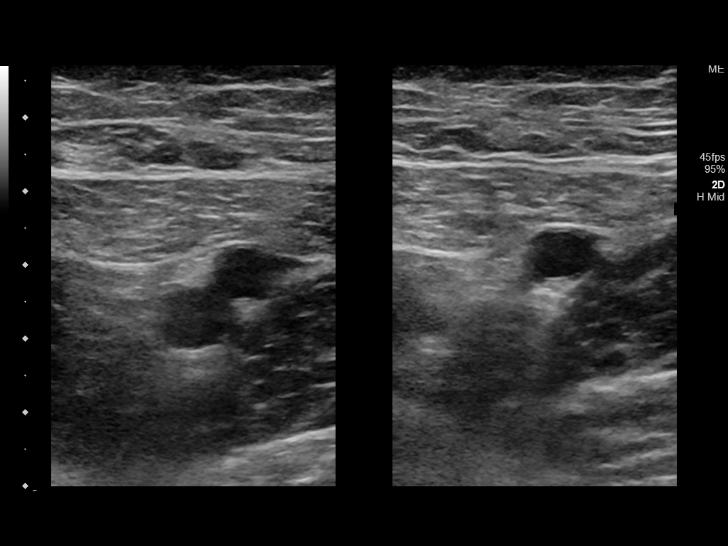
[im 16/59]
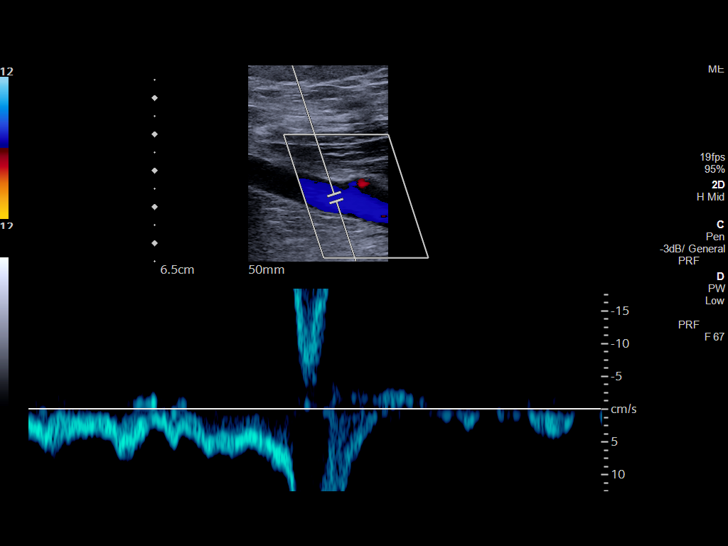
[im 21/59]
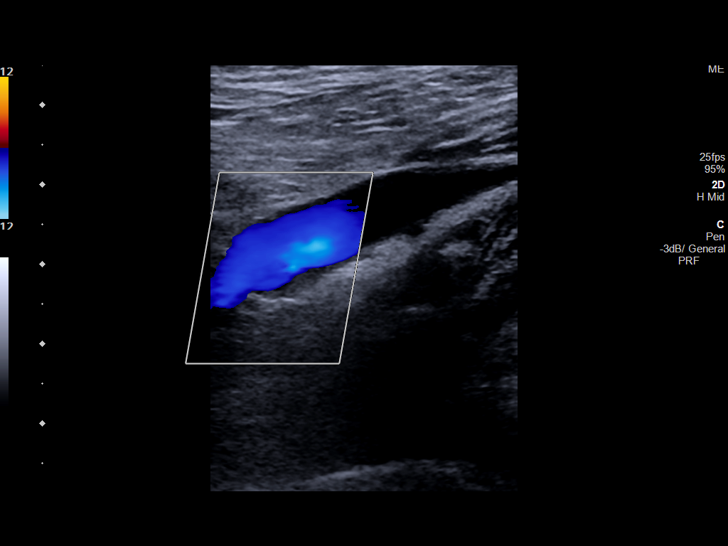
[im 26/59]
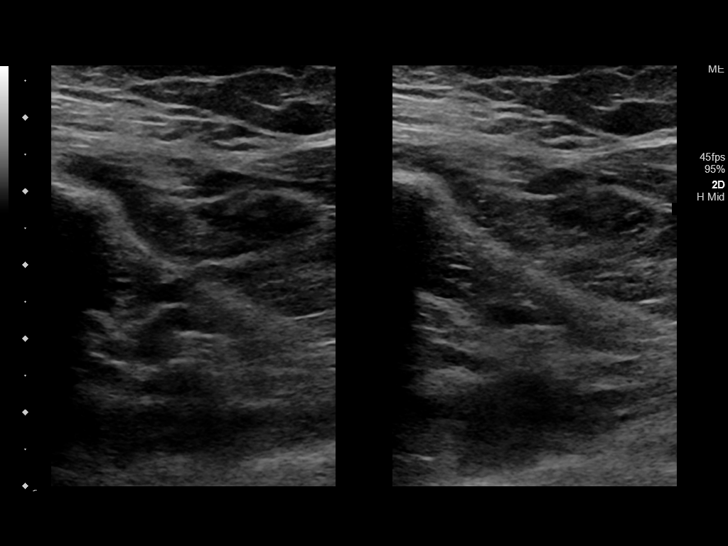
[im 31/59]
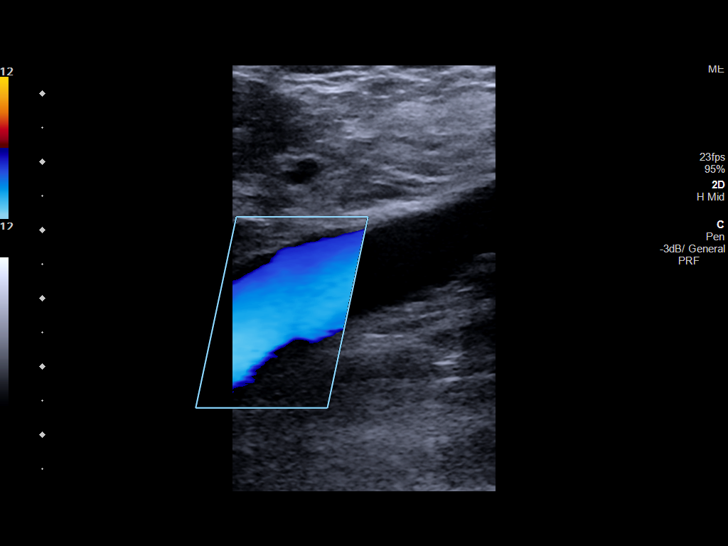
[im 33/59]
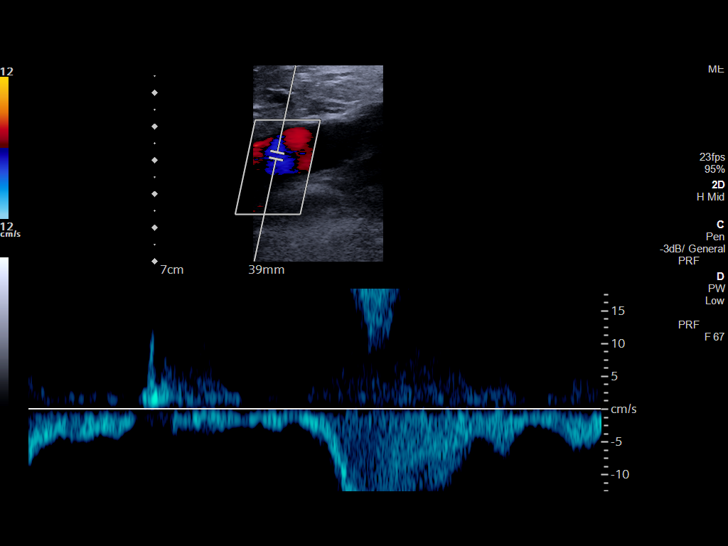
[im 38/59]
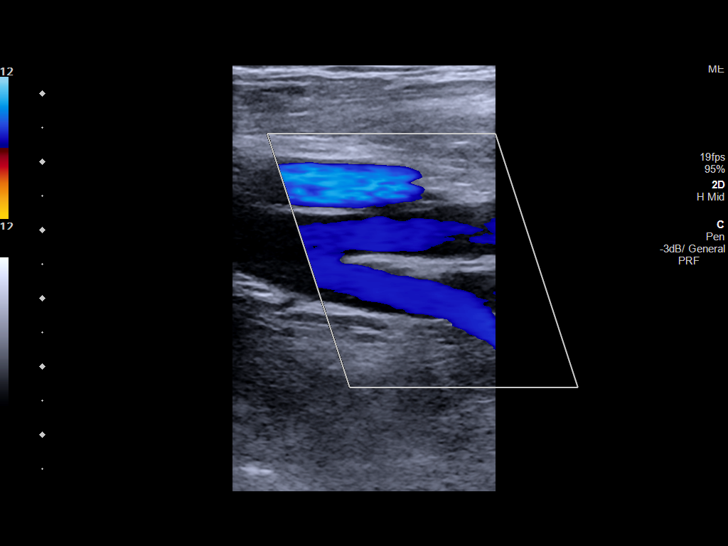
[im 43/59]
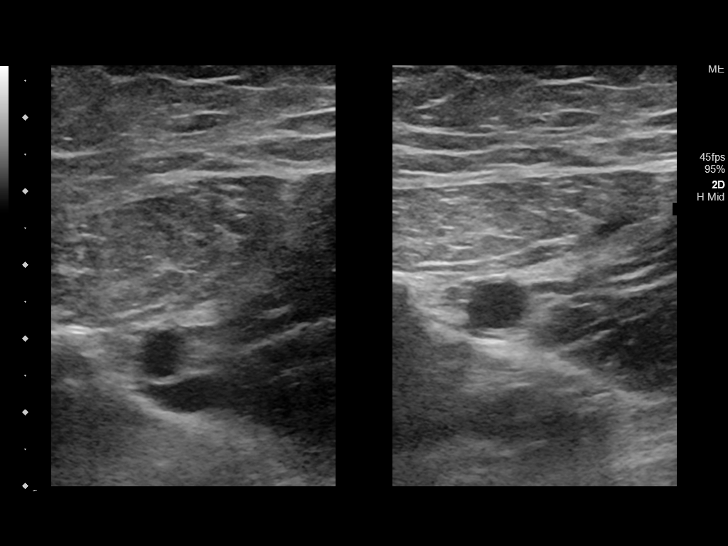
[im 48/59]
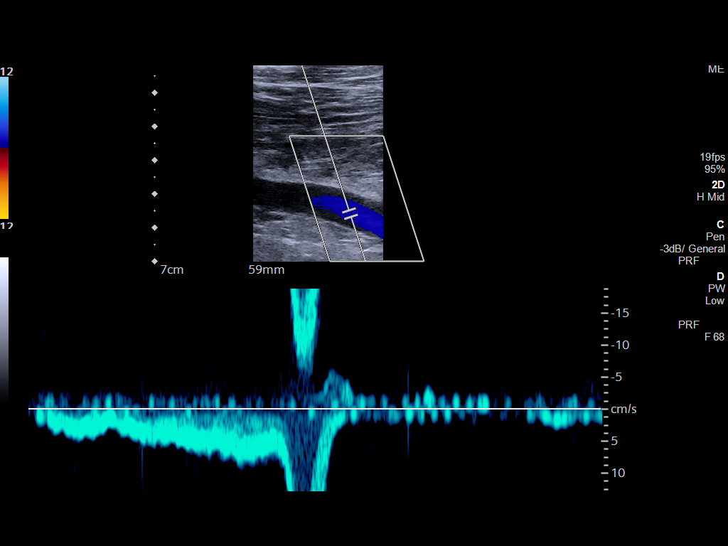
[im 53/59]
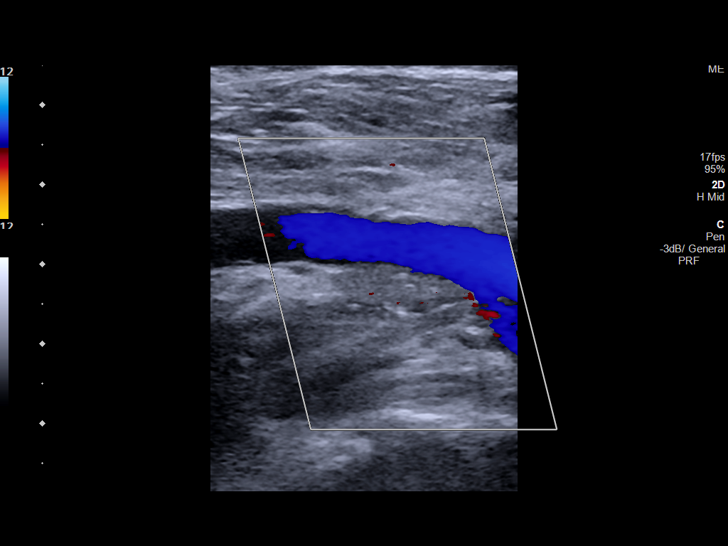
[im 59/59]
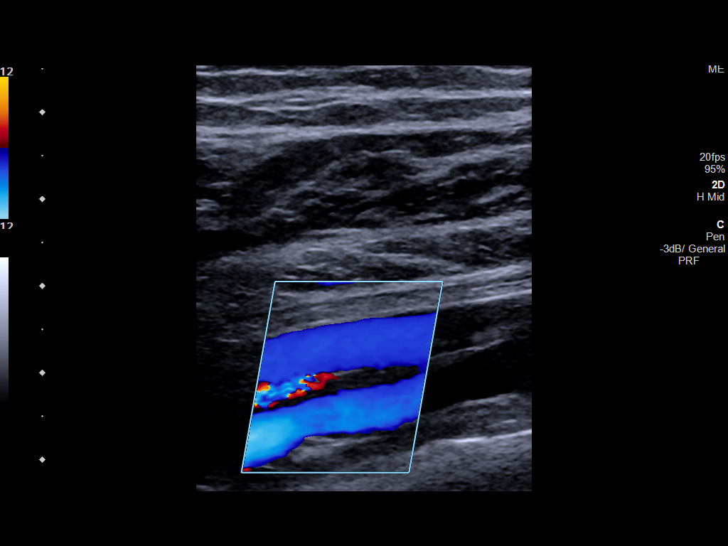

[13 of 24 positions shown; findings below may reference images not displayed]

FINDINGS: RIGHT LOWER EXTREMITY

Common Femoral Vein: No evidence of thrombus. Normal
compressibility, respiratory phasicity and response to augmentation.

Saphenofemoral Junction: No evidence of thrombus. Normal
compressibility and flow on color Doppler imaging.

Profunda Femoral Vein: No evidence of thrombus. Normal
compressibility and flow on color Doppler imaging.

Femoral Vein: No evidence of thrombus. Normal compressibility,
respiratory phasicity and response to augmentation.

Popliteal Vein: No evidence of thrombus. Normal compressibility,
respiratory phasicity and response to augmentation.

Calf Veins: No evidence of thrombus. Normal compressibility and flow
on color Doppler imaging.

Superficial Great Saphenous Vein: No evidence of thrombus. Normal
compressibility.

Venous Reflux:  None.

Other Findings:  None.

LEFT LOWER EXTREMITY

Common Femoral Vein: No evidence of thrombus. Normal
compressibility, respiratory phasicity and response to augmentation.

Saphenofemoral Junction: No evidence of thrombus. Normal
compressibility and flow on color Doppler imaging.

Profunda Femoral Vein: No evidence of thrombus. Normal
compressibility and flow on color Doppler imaging.

Femoral Vein: No evidence of thrombus. Normal compressibility,
respiratory phasicity and response to augmentation.

Popliteal Vein: No evidence of thrombus. Normal compressibility,
respiratory phasicity and response to augmentation.

Calf Veins: No evidence of thrombus. Normal compressibility and flow
on color Doppler imaging.

Superficial Great Saphenous Vein: No evidence of thrombus. Normal
compressibility.

Venous Reflux:  None.

Other Findings:  None.
IMPRESSION: No evidence of DVT within either lower extremity.

## 2020-05-27 DIAGNOSIS — N529 Male erectile dysfunction, unspecified: Secondary | ICD-10-CM | POA: Insufficient documentation

## 2020-06-06 ENCOUNTER — Encounter (INDEPENDENT_AMBULATORY_CARE_PROVIDER_SITE_OTHER): Payer: Medicare Other | Admitting: Vascular Surgery

## 2020-06-08 LAB — EXTERNAL GENERIC LAB PROCEDURE: COLOGUARD: NEGATIVE

## 2020-06-08 LAB — COLOGUARD: COLOGUARD: NEGATIVE

## 2020-06-09 ENCOUNTER — Encounter (INDEPENDENT_AMBULATORY_CARE_PROVIDER_SITE_OTHER): Payer: Medicare Other | Admitting: Vascular Surgery

## 2020-11-28 ENCOUNTER — Ambulatory Visit (INDEPENDENT_AMBULATORY_CARE_PROVIDER_SITE_OTHER): Payer: Medicare Other | Admitting: Vascular Surgery

## 2020-11-28 ENCOUNTER — Other Ambulatory Visit: Payer: Self-pay

## 2020-11-28 ENCOUNTER — Encounter (INDEPENDENT_AMBULATORY_CARE_PROVIDER_SITE_OTHER): Payer: Self-pay | Admitting: Vascular Surgery

## 2020-11-28 VITALS — BP 127/80 | HR 116 | Ht 74.0 in | Wt 265.0 lb

## 2020-11-28 DIAGNOSIS — M79604 Pain in right leg: Secondary | ICD-10-CM

## 2020-11-28 DIAGNOSIS — I10 Essential (primary) hypertension: Secondary | ICD-10-CM | POA: Diagnosis not present

## 2020-11-28 DIAGNOSIS — M79605 Pain in left leg: Secondary | ICD-10-CM

## 2020-11-28 DIAGNOSIS — R7303 Prediabetes: Secondary | ICD-10-CM

## 2020-11-28 DIAGNOSIS — M7989 Other specified soft tissue disorders: Secondary | ICD-10-CM | POA: Diagnosis not present

## 2020-11-28 DIAGNOSIS — I4891 Unspecified atrial fibrillation: Secondary | ICD-10-CM

## 2020-11-28 DIAGNOSIS — M79609 Pain in unspecified limb: Secondary | ICD-10-CM | POA: Insufficient documentation

## 2020-11-28 NOTE — Assessment & Plan Note (Signed)
blood pressure control important in reducing the progression of atherosclerotic disease. On appropriate oral medications.  

## 2020-11-28 NOTE — Assessment & Plan Note (Signed)
blood glucose control important in reducing the progression of atherosclerotic disease. Also, involved in wound healing. On appropriate medications.  

## 2020-11-28 NOTE — Progress Notes (Signed)
Patient ID: Shane Lamb, male   DOB: 08-28-1951, 70 y.o.   MRN: [id number]  Chief Complaint  Patient presents with  . New Patient (Initial Visit)    edema    HPI [name] is a 70 y.o. male.  I am asked to see the patient by Dr.  Marcello Fennel for evaluation of leg pain and swelling.  For a year or so, the patient's had problems with swelling in his legs.  Both legs are affected.  They are about the same in terms of their swelling.  He has a little more pain in his right leg than his left.  This wakes him from sleep but starts in his knee and radiates down the leg.  He has no open wounds or infection.  He says he actually walks okay.  No open wounds.  He has noticed as he has become more sedentary and gained weight that his swelling has gotten worse.  He has compression stockings and was wearing these for a time, but he has not for many months now.     Past Medical History:  Diagnosis Date  . Atrial fibrillation (HCC)   . Hypertension   . Prostatitis, chronic     Past Surgical History:  Procedure Laterality Date  . CARDIAC ELECTROPHYSIOLOGY MAPPING AND ABLATION    . JOINT REPLACEMENT     hip replacement  . REPLACEMENT TOTAL KNEE       Family History  Problem Relation Age of Onset  . Hypertension Father   . Depression Father   no bleeding or clotting disorders No aneurysms   Social History   Tobacco Use  . Smoking status: Former Smoker    Types: Cigarettes    Quit date: 1975    Years since quitting: 47.0  . Smokeless tobacco: Former Neurosurgeon    Quit date: 1974  Substance Use Topics  . Alcohol use: Yes  . Drug use: No     Allergies  Allergen Reactions  . Amiodarone Nausea And Vomiting    Current Outpatient Medications  Medication Sig Dispense Refill  . acetaminophen (TYLENOL) 325 MG tablet Take 650 mg by mouth every 6 (six) hours as needed.    Marland Kitchen apixaban (ELIQUIS) 5 MG TABS tablet Take by mouth.    . B Complex-C (B-COMPLEX WITH VITAMIN C) tablet Take 1  tablet by mouth daily.    . bisacodyl (DULCOLAX) 10 MG suppository Place 10 mg rectally as needed for moderate constipation.    . bisacodyl (FLEET) 10 MG/30ML ENEM Place 10 mg rectally See admin instructions. Every 72 hours prn    . carboxymethylcellulose (REFRESH PLUS) 0.5 % SOLN Place 1 drop into both eyes 3 (three) times daily as needed.    . cephALEXin (KEFLEX) 500 MG capsule Take 1 capsule (500 mg total) by mouth 3 (three) times daily. 21 capsule 0  . Cholecalciferol 50 MCG (2000 UT) CAPS Take by mouth.    . cyclobenzaprine (FLEXERIL) 10 MG tablet Take 1 tablet by mouth 3 (three) times daily as needed.    . diltiazem (DILACOR XR) 240 MG 24 hr capsule Take 240 mg by mouth daily.    Marland Kitchen diltiazem (TIAZAC) 240 MG 24 hr capsule Take by mouth.    . dofetilide (TIKOSYN) 125 MCG capsule Take 375 mcg by mouth 2 (two) times daily.    . furosemide (LASIX) 40 MG tablet 1-2 tabs a day as neeed    . hydrochlorothiazide (MICROZIDE) 12.5 MG capsule Take 12.5 mg  by mouth daily.    Marland Kitchen losartan (COZAAR) 100 MG tablet Take 100 mg by mouth daily.    . Magnesium 200 MG TABS Take 2 tablets by mouth daily.    . magnesium hydroxide (MILK OF MAGNESIA) 400 MG/5ML suspension Take 30 mLs by mouth every 4 (four) hours as needed for mild constipation.    . Menaquinone-7 (VITAMIN K2) 100 MCG CAPS Take 1 tablet by mouth daily.    . methocarbamol (ROBAXIN) 500 MG tablet Take 500 mg by mouth every 6 (six) hours as needed for muscle spasms.    . metoprolol succinate (TOPROL-XL) 25 MG 24 hr tablet Take 25 mg by mouth 2 (two) times daily.    . Omega-3 1000 MG CAPS Take by mouth.    Marland Kitchen oxycodone (OXY-IR) 5 MG capsule Take 2 capsules (10 mg total) by mouth every 4 (four) hours as needed. 30 capsule 0  . polyvinyl alcohol (LIQUIFILM TEARS) 1.4 % ophthalmic solution Administer 1 drop to both eyes as needed for dry eyes.    Marland Kitchen senna-docusate (SENOKOT-S) 8.6-50 MG tablet Take 1 tablet by mouth 2 (two) times daily.    . sertraline  (ZOLOFT) 50 MG tablet Take 50 mg by mouth daily.    . sildenafil (VIAGRA) 100 MG tablet Take 100 mg by mouth daily as needed for erectile dysfunction.    . tadalafil (CIALIS) 10 MG tablet Take by mouth.    . tamsulosin (FLOMAX) 0.4 MG CAPS capsule Take by mouth.    . traZODone (DESYREL) 100 MG tablet Take 100 mg by mouth at bedtime.    Marland Kitchen warfarin (COUMADIN) 4 MG tablet Take 1 tablet (4 mg total) by mouth one time only at 6 PM.     No current facility-administered medications for this visit.      REVIEW OF SYSTEMS (Negative unless checked)  Constitutional: [] Weight loss  [] Fever  [] Chills Cardiac: [] Chest pain   [] Chest pressure   [x] Palpitations   [] Shortness of breath when laying flat   [] Shortness of breath at rest   [] Shortness of breath with exertion. Vascular:  [x] Pain in legs with walking   [x] Pain in legs at rest   [] Pain in legs when laying flat   [] Claudication   [] Pain in feet when walking  [] Pain in feet at rest  [] Pain in feet when laying flat   [] History of DVT   [] Phlebitis   [x] Swelling in legs   [] Varicose veins   [] Non-healing ulcers Pulmonary:   [] Uses home oxygen   [] Productive cough   [] Hemoptysis   [] Wheeze  [] COPD   [] Asthma Neurologic:  [] Dizziness  [] Blackouts   [] Seizures   [] History of stroke   [] History of TIA  [] Aphasia   [] Temporary blindness   [] Dysphagia   [] Weakness or numbness in arms   [] Weakness or numbness in legs Musculoskeletal:  [x] Arthritis   [] Joint swelling   [x] Joint pain   [] Low back pain Hematologic:  [] Easy bruising  [] Easy bleeding   [] Hypercoagulable state   [] Anemic  [] Hepatitis Gastrointestinal:  [] Blood in stool   [] Vomiting blood  [] Gastroesophageal reflux/heartburn   [] Abdominal pain Genitourinary:  [] Chronic kidney disease   [] Difficult urination  [] Frequent urination  [] Burning with urination   [] Hematuria Skin:  [] Rashes   [] Ulcers   [] Wounds Psychological:  [] History of anxiety   []  History of major depression.    Physical Exam BP  127/80   Pulse (!) 116   Ht 6\' 2"  (1.88 m)   Wt 265 lb (120.2 kg)  BMI 34.02 kg/m  Gen:  WD/WN, NAD Head: [place]/AT, No temporalis wasting.  Ear/Nose/Throat: Hearing grossly intact, nares w/o erythema or drainage, oropharynx w/o Erythema/Exudate Eyes: Conjunctiva clear, sclera non-icteric  Neck: trachea midline.  No JVD.  Pulmonary:  Good air movement, respirations not labored, no use of accessory muscles  Cardiac: Irregular and tachycardic Vascular:  Vessel Right Left  Radial Palpable Palpable                          DP  trace  trace  PT  not palpable  not palpable   Gastrointestinal:. No masses, surgical incisions, or scars. Musculoskeletal: M/S 5/5 throughout.  Extremities without ischemic changes.  No deformity or atrophy.  Moderate stasis dermatitis changes present bilaterally.  2+ bilateral lower extremity edema. Neurologic: Sensation grossly intact in extremities.  Symmetrical.  Speech is fluent. Motor exam as listed above. Psychiatric: Judgment intact, Mood & affect appropriate for pt's clinical situation. Dermatologic: No rashes or ulcers noted.  No cellulitis or open wounds.    Radiology No results found.  Labs No results found for this or any previous visit (from the past 2160 hour(s)).  Assessment/Plan:  Swelling of limb I have had a long discussion with the patient regarding swelling and why it  causes symptoms.  Patient will begin wearing graduated compression stockings class 1 (20-30 mmHg) on a daily basis a prescription was given. The patient will  beginning wearing the stockings first thing in the morning and removing them in the evening. The patient is instructed specifically not to sleep in the stockings.   In addition, behavioral modification will be initiated.  This will include frequent elevation, use of over the counter pain medications and exercise such as walking.  I have reviewed systemic causes for chronic edema such as liver, kidney and cardiac  etiologies.  The patient denies problems with these organ systems.    Consideration for a lymph pump will also be made based upon the effectiveness of conservative therapy.  This would help to improve the edema control and prevent sequela such as ulcers and infections   Patient should undergo duplex ultrasound of the venous system to ensure that DVT or reflux is not present.  The patient will follow-up with me after the ultrasound.    Essential hypertension blood pressure control important in reducing the progression of atherosclerotic disease. On appropriate oral medications.   Atrial fibrillation (HCC) On anticoagulation  Prediabetes blood glucose control important in reducing the progression of atherosclerotic disease. Also, involved in wound healing. On appropriate medications.   Pain in limb  Recommend:  The patient has atypical pain symptoms for pure atherosclerotic disease. 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  Noninvasive studies including ABI's and venous ultrasound of the legs will be obtained and the patient will follow up with me to review these studies.  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  Patient should have an evaluation of his LS spine which I defer to the primary service.  The patient should continue walking and begin a more formal exercise program. The patient should continue his antiplatelet therapy and aggressive treatment of the lipid abnormalities.  The patient should begin wearing graduated compression socks 15-20 mmHg strength to control edema.       Festus Barren 11/28/2020, 2:40 PM   This note was created with [employer]  medical transcription system.  Any errors from dictation are unintentional.

## 2020-11-28 NOTE — Assessment & Plan Note (Signed)
On anticoagulation 

## 2020-11-28 NOTE — Assessment & Plan Note (Signed)
Recommend:  The patient has atypical pain symptoms for pure atherosclerotic disease. 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  Noninvasive studies including ABI's and venous ultrasound of the legs will be obtained and the patient will follow up with me to review these studies.  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  Patient should have an evaluation of his LS spine which I defer to the primary service.  The patient should continue walking and begin a more formal exercise program. The patient should continue his antiplatelet therapy and aggressive treatment of the lipid abnormalities.  The patient should begin wearing graduated compression socks 15-20 mmHg strength to control edema.  

## 2020-11-28 NOTE — Assessment & Plan Note (Signed)

## 2020-12-13 ENCOUNTER — Ambulatory Visit (INDEPENDENT_AMBULATORY_CARE_PROVIDER_SITE_OTHER): Payer: Medicare Other | Admitting: Nurse Practitioner

## 2020-12-13 ENCOUNTER — Encounter (INDEPENDENT_AMBULATORY_CARE_PROVIDER_SITE_OTHER): Payer: Self-pay | Admitting: Nurse Practitioner

## 2020-12-13 ENCOUNTER — Other Ambulatory Visit: Payer: Self-pay

## 2020-12-13 ENCOUNTER — Ambulatory Visit (INDEPENDENT_AMBULATORY_CARE_PROVIDER_SITE_OTHER): Payer: Medicare Other

## 2020-12-13 VITALS — BP 143/100 | HR 118 | Ht 74.0 in | Wt 266.0 lb

## 2020-12-13 DIAGNOSIS — M79604 Pain in right leg: Secondary | ICD-10-CM | POA: Diagnosis not present

## 2020-12-13 DIAGNOSIS — M160 Bilateral primary osteoarthritis of hip: Secondary | ICD-10-CM

## 2020-12-13 DIAGNOSIS — M7989 Other specified soft tissue disorders: Secondary | ICD-10-CM

## 2020-12-13 DIAGNOSIS — R600 Localized edema: Secondary | ICD-10-CM | POA: Diagnosis not present

## 2020-12-13 DIAGNOSIS — M79605 Pain in left leg: Secondary | ICD-10-CM

## 2020-12-13 DIAGNOSIS — I10 Essential (primary) hypertension: Secondary | ICD-10-CM | POA: Diagnosis not present

## 2020-12-22 ENCOUNTER — Encounter (INDEPENDENT_AMBULATORY_CARE_PROVIDER_SITE_OTHER): Payer: Self-pay | Admitting: Nurse Practitioner

## 2020-12-22 NOTE — Progress Notes (Signed)
Subjective:    Patient ID: Shane Lamb, male    DOB: 1951-06-26, 70 y.o.   MRN: [id number] Chief Complaint  Patient presents with  . Follow-up    U/S     Shane Lamb is a 70 y.o. male.  The patient returns today for evaluation of his lower extremity leg pain and swelling.  For approximately a year or so the patient has had issues with lower extremity edema.  The patient previously was wearing medical grade 1 compression stockings on a regular basis however he notes he has not been as consistent lately.  The swelling is relatively the same bilaterally.  Patient has there is a little bit more pain in his right lower extremity than his left.  The pain sometimes wakes him from sleep but he denies pain today in his leg from the bed.  He denies any claudication-like symptoms.  He denies any open wounds or infection.  He also notes that he has had some recent weight gain and become more sedentary and this all came right before the worsening swelling.  Today noninvasive studies show an ABI of 1.36 on the right and 1.24 on the left.  The patient has triphasic tibial artery waveforms bilaterally with good toe waveforms bilaterally.  The patient also underwent a lower extremity venous reflux study which shows no evidence of DVT or superficial venous thrombosis bilaterally.  No evidence of deep venous insufficiency seen bilaterally.  No evidence of superficial venous reflux seen bilaterally.   Review of Systems  Cardiovascular: Positive for leg swelling.  Musculoskeletal: Positive for arthralgias.  All other systems reviewed and are negative.      Objective:   Physical Exam Vitals reviewed.  HENT:     Head: Normocephalic.  Cardiovascular:     Rate and Rhythm: Normal rate.     Pulses: Normal pulses.  Pulmonary:     Effort: Pulmonary effort is normal.  Musculoskeletal:     Right lower leg: Edema present.     Left lower leg: Edema present.  Neurological:     Mental Status: He is alert  and oriented to person, place, and time.  Psychiatric:        Mood and Affect: Mood normal.        Behavior: Behavior normal.        Thought Content: Thought content normal.        Judgment: Judgment normal.     BP (!) 143/100   Pulse (!) 118   Ht 6\' 2"  (1.88 m)   Wt 266 lb (120.7 kg)   BMI 34.15 kg/m   Past Medical History:  Diagnosis Date  . Atrial fibrillation (HCC)   . Hypertension   . Prostatitis, chronic     Social History   Socioeconomic History  . Marital status: Married    Spouse name: Not on file  . Number of children: Not on file  . Years of education: Not on file  . Highest education level: Not on file  Occupational History  . Occupation: retired  Tobacco Use  . Smoking status: Former Smoker    Types: Cigarettes    Quit date: 1975    Years since quitting: 47.1  . Smokeless tobacco: Former    Quit date: 1974  Substance and Sexual Activity  . Alcohol use: Yes  . Drug use: No  . Sexual activity: Yes  Other Topics Concern  . Not on file  Social History Narrative  . Not on file  Social Determinants of Health   Financial Resource Strain: Not on file  Food Insecurity: Not on file  Transportation Needs: Not on file  Physical Activity: Not on file  Stress: Not on file  Social Connections: Not on file  Intimate Partner Violence: Not on file    Past Surgical History:  Procedure Laterality Date  . CARDIAC ELECTROPHYSIOLOGY MAPPING AND ABLATION    . JOINT REPLACEMENT     hip replacement  . REPLACEMENT TOTAL KNEE      Family History  Problem Relation Age of Onset  . Hypertension Father   . Depression Father     Allergies  Allergen Reactions  . Amiodarone Nausea And Vomiting    CBC Latest Ref Rng & Units 06/23/2018 04/10/2016 04/10/2016  WBC 3.8 - 10.6 K/uL 9.6 8.3 8.7  Hemoglobin 13.0 - 18.0 g/dL 28.0 03.4 91.7  Hematocrit 40.0 - 52.0 % 45.1 38.3(L) 39.6(L)  Platelets 150 - 440 K/uL 212 266 287      CMP     Component Value  Date/Time   NA 139 06/23/2018 1745   K 4.3 06/23/2018 1745   CL 108 06/23/2018 1745   CO2 23 06/23/2018 1745   GLUCOSE 115 (H) 06/23/2018 1745   BUN 9 06/23/2018 1745   CREATININE 1.03 06/23/2018 1745   CALCIUM 9.1 06/23/2018 1745   PROT 7.1 04/10/2016 0020   ALBUMIN 3.6 04/10/2016 0020   AST 28 04/10/2016 0020   ALT 25 04/10/2016 0020   ALKPHOS 59 04/10/2016 0020   BILITOT 0.4 04/10/2016 0020   GFRNONAA >60 06/23/2018 1745   GFRAA >60 06/23/2018 1745     VAS [place] ABI WITH/WO TBI  Result Date: 12/15/2020 LOWER EXTREMITY DOPPLER STUDY Indications: Rest pain.  Performing Technologist: Debbe Bales RVS  Examination Guidelines: A complete evaluation includes at minimum, Doppler waveform signals and systolic blood pressure reading at the level of bilateral brachial, anterior tibial, and posterior tibial arteries, when vessel segments are accessible. Bilateral testing is considered an integral part of a complete examination. Photoelectric Plethysmograph (PPG) waveforms and toe systolic pressure readings are included as required and additional duplex testing as needed. Limited examinations for reoccurring indications may be performed as noted.  ABI Findings: +---------+------------------+-----+---------+--------+ Right    Rt Pressure (mmHg)IndexWaveform Comment  +---------+------------------+-----+---------+--------+ Brachial 120                                      +---------+------------------+-----+---------+--------+ ATA      164               1.33 triphasic         +---------+------------------+-----+---------+--------+ PTA      167               1.36 triphasic         +---------+------------------+-----+---------+--------+ Great Toe191               1.55 Normal            +---------+------------------+-----+---------+--------+ +---------+------------------+-----+---------+-------+ Left     Lt Pressure (mmHg)IndexWaveform Comment  +---------+------------------+-----+---------+-------+ Brachial 123                                     +---------+------------------+-----+---------+-------+ ATA      153               1.24 triphasic        +---------+------------------+-----+---------+-------+  PTA      133               1.08 triphasic        +---------+------------------+-----+---------+-------+ Great Toe206               1.67 Normal           +---------+------------------+-----+---------+-------+ +-------+-----------+-----------+------------+------------+ ABI/TBIToday's ABIToday's TBIPrevious ABIPrevious TBI +-------+-----------+-----------+------------+------------+ Right  1.36       1.55                                +-------+-----------+-----------+------------+------------+ Left   1.24       1.67                                +-------+-----------+-----------+------------+------------+  Summary: Right: Resting right ankle-brachial index is within normal range. No evidence of significant right lower extremity arterial disease. The right toe-brachial index is normal. Left: Resting left ankle-brachial index is within normal range. No evidence of significant left lower extremity arterial disease. The left toe-brachial index is normal.  *See table(s) above for measurements and observations.  Electronically signed by Festus Barren MD on 12/15/2020 at 12:26:29 PM.   Final        Assessment & Plan:   1. 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  Patient should have an evaluation of his LS spine and or  hips which I defer to the primary service.  Noninvasive studies including venous ultrasound of the legs do not  identify vascular problems  The patient should continue walking and begin a more formal exercise program. The patient should continue his antiplatelet therapy and aggressive treatment of the lipid abnormalities. The patient should begin wearing graduated compression socks 15-20 mmHg strength to control her mild edema.  Patient will follow-up with me on a PRN basis  Further work-up of her lower extremity pain is deferred to the primary service     2. Swelling of limb No surgery or intervention at this point in time.  I have reviewed my discussion with the patient regarding venous insufficiency and why it causes symptoms. I have discussed with the patient the chronic skin changes that accompany venous insufficiency and the long term sequela such as ulceration. Patient will contnue wearing graduated compression stockings on a daily basis.  The patient will put the stockings on first thing in the morning and removing them in the evening. The patient is reminded not to sleep in the stockings.  In addition, behavioral modification including elevation during the day will be initiated. Exercise is strongly encouraged.  Duplex ultrasound of the bilateral lower extremities show no evidence of DVT, superficial venous stenosis, deep venous insufficiency or venous reflux.   The patient will follow up with me PRN should anything change.  The patient voices agreement with this plan.   3. Essential hypertension Continue antihypertensive medications as already ordered, these medications have been reviewed and there are no changes at this time.   4. Bilateral hip joint arthritis This could be a cause of lower extremity discomfort.   Current Outpatient Medications on File Prior to  Visit  Medication Sig Dispense Refill  . acetaminophen (TYLENOL) 325 MG tablet Take 650 mg by mouth every 6 (six) hours as needed.    Marland Kitchen apixaban (ELIQUIS) 5 MG TABS tablet Take by mouth.    . B Complex-C (B-COMPLEX WITH  VITAMIN C) tablet Take 1 tablet by mouth daily.    . bisacodyl (DULCOLAX) 10 MG suppository Place 10 mg rectally as needed for moderate constipation.    . bisacodyl (FLEET) 10 MG/30ML ENEM Place 10 mg rectally See admin instructions. Every 72 hours prn    . carboxymethylcellulose (REFRESH PLUS) 0.5 % SOLN Place 1 drop into both eyes 3 (three) times daily as needed.    . cephALEXin (KEFLEX) 500 MG capsule Take 1 capsule (500 mg total) by mouth 3 (three) times daily. 21 capsule 0  . Cholecalciferol 50 MCG (2000 UT) CAPS Take by mouth.    . cyclobenzaprine (FLEXERIL) 10 MG tablet Take 1 tablet by mouth 3 (three) times daily as needed.    . diltiazem (DILACOR XR) 240 MG 24 hr capsule Take 240 mg by mouth daily.    Marland Kitchen diltiazem (TIAZAC) 240 MG 24 hr capsule Take by mouth.    . dofetilide (TIKOSYN) 125 MCG capsule Take 375 mcg by mouth 2 (two) times daily.    . furosemide (LASIX) 40 MG tablet 1-2 tabs a day as neeed    . hydrochlorothiazide (MICROZIDE) 12.5 MG capsule Take 12.5 mg by mouth daily.    Marland Kitchen losartan (COZAAR) 100 MG tablet Take 100 mg by mouth daily.    . Magnesium 200 MG TABS Take 2 tablets by mouth daily.    . magnesium hydroxide (MILK OF MAGNESIA) 400 MG/5ML suspension Take 30 mLs by mouth every 4 (four) hours as needed for mild constipation.    . Menaquinone-7 (VITAMIN K2) 100 MCG CAPS Take 1 tablet by mouth daily.    . methocarbamol (ROBAXIN) 500 MG tablet Take 500 mg by mouth every 6 (six) hours as needed for muscle spasms.    . metoprolol succinate (TOPROL-XL) 25 MG 24 hr tablet Take 25 mg by mouth 2 (two) times daily.    . Omega-3 1000 MG CAPS Take by mouth.    . oxybutynin (DITROPAN) 5 MG tablet Take by mouth.    Marland Kitchen oxycodone (OXY-IR) 5 MG capsule Take 2 capsules (10 mg total) by mouth every 4 (four) hours as needed. 30 capsule 0  . polyvinyl alcohol (LIQUIFILM TEARS) 1.4 % ophthalmic solution Administer 1 drop to both eyes as needed for dry eyes.    Marland Kitchen senna-docusate (SENOKOT-S)  8.6-50 MG tablet Take 1 tablet by mouth 2 (two) times daily.    . sertraline (ZOLOFT) 50 MG tablet Take 50 mg by mouth daily.    . sildenafil (VIAGRA) 100 MG tablet Take 100 mg by mouth daily as needed for erectile dysfunction.    . tadalafil (CIALIS) 10 MG tablet Take by mouth.    . tamsulosin (FLOMAX) 0.4 MG CAPS capsule Take by mouth.    . traZODone (DESYREL) 100 MG tablet Take 100 mg by mouth at bedtime.    Marland Kitchen warfarin (COUMADIN) 4 MG tablet Take 1 tablet (4 mg total) by mouth one time only at 6 PM.     No current facility-administered medications on file prior to visit.    There are no Patient Instructions on file for this visit. No follow-ups on file.   Georgiana Spinner, NP

## 2021-01-04 ENCOUNTER — Other Ambulatory Visit: Payer: Self-pay | Admitting: Internal Medicine

## 2021-01-04 DIAGNOSIS — M545 Low back pain, unspecified: Secondary | ICD-10-CM

## 2021-01-04 DIAGNOSIS — G8929 Other chronic pain: Secondary | ICD-10-CM

## 2021-01-10 ENCOUNTER — Ambulatory Visit
Admission: RE | Admit: 2021-01-10 | Discharge: 2021-01-10 | Disposition: A | Discharge status: Home / Self Care | Payer: Medicare Other | Source: Ambulatory Visit | Attending: Internal Medicine | Admitting: Internal Medicine

## 2021-01-10 ENCOUNTER — Other Ambulatory Visit: Payer: Self-pay

## 2021-01-10 ENCOUNTER — Other Ambulatory Visit: Payer: Self-pay | Admitting: Internal Medicine

## 2021-01-10 DIAGNOSIS — G8929 Other chronic pain: Secondary | ICD-10-CM | POA: Insufficient documentation

## 2021-01-10 DIAGNOSIS — M545 Low back pain, unspecified: Secondary | ICD-10-CM | POA: Insufficient documentation

## 2021-01-10 DIAGNOSIS — H539 Unspecified visual disturbance: Secondary | ICD-10-CM

## 2021-01-10 DIAGNOSIS — R42 Dizziness and giddiness: Secondary | ICD-10-CM

## 2021-01-10 MED ORDER — GADOBUTROL 1 MMOL/ML IV SOLN
10.0000 mL | Freq: Once | INTRAVENOUS | Status: AC | PRN
  Administered 2021-01-10: 10 mL via INTRAVENOUS

## 2021-01-11 ENCOUNTER — Ambulatory Visit
Admission: RE | Admit: 2021-01-11 | Discharge: 2021-01-11 | Disposition: A | Discharge status: Home / Self Care | Payer: Medicare Other | Source: Ambulatory Visit | Attending: Internal Medicine | Admitting: Internal Medicine

## 2021-01-11 DIAGNOSIS — H539 Unspecified visual disturbance: Secondary | ICD-10-CM | POA: Diagnosis not present

## 2021-01-11 DIAGNOSIS — R42 Dizziness and giddiness: Secondary | ICD-10-CM | POA: Diagnosis present

## 2021-02-08 ENCOUNTER — Ambulatory Visit: Payer: Medicare Other | Admitting: Podiatry

## 2021-02-15 ENCOUNTER — Other Ambulatory Visit: Payer: Self-pay

## 2021-02-15 ENCOUNTER — Ambulatory Visit: Payer: Medicare Other | Admitting: Podiatry

## 2021-02-15 ENCOUNTER — Encounter: Payer: Self-pay | Admitting: Podiatry

## 2021-02-15 DIAGNOSIS — M79674 Pain in right toe(s): Secondary | ICD-10-CM

## 2021-02-15 DIAGNOSIS — M7989 Other specified soft tissue disorders: Secondary | ICD-10-CM

## 2021-02-15 DIAGNOSIS — B351 Tinea unguium: Secondary | ICD-10-CM | POA: Insufficient documentation

## 2021-02-15 DIAGNOSIS — M79675 Pain in left toe(s): Secondary | ICD-10-CM

## 2021-02-15 DIAGNOSIS — Z7901 Long term (current) use of anticoagulants: Secondary | ICD-10-CM | POA: Diagnosis not present

## 2021-02-15 NOTE — Progress Notes (Signed)
This patient returns to my office for at risk foot care.  This patient requires this care by a professional since this patient will be at risk due to having coagulation defect due to eliquis.  This patient is unable to cut nails himself since the patient cannot reach his nails.These nails are painful walking and wearing shoes.  This patient presents for at risk foot care today.  General Appearance  Alert, conversant and in no acute stress.  Vascular  Dorsalis pedis  are weakly   palpable due to leg/foot swelling   Bilaterally.  Posterior tibial pulses are not palpable due to swelling.  Capillary return is within normal limits  bilaterally. Temperature is within normal limits  bilaterally.  Neurologic  Senn-Weinstein monofilament wire test diminished   bilaterally. Muscle power within normal limits bilaterally.  Nails Thick disfigured discolored nails with subungual debris  from hallux to fifth toes bilaterally. No evidence of bacterial infection or drainage bilaterally.  Orthopedic  No limitations of motion  feet .  No crepitus or effusions noted.  No bony pathology or digital deformities noted.  Skin  normotropic skin with no porokeratosis noted bilaterally.  No signs of infections or ulcers noted.     Onychomycosis  Pain in right toes  Pain in left toes  Consent was obtained for treatment procedures.   Mechanical debridement of nails 1-5  bilaterally performed with a nail nipper.  Filed with dremel without incident.    Return office visit   3 months                  Told patient to return for periodic foot care and evaluation due to potential at risk complications.   Helane Gunther DPM

## 2021-05-22 ENCOUNTER — Ambulatory Visit: Payer: Medicare Other | Admitting: Podiatry

## 2021-06-18 ENCOUNTER — Ambulatory Visit: Payer: Medicare Other | Admitting: Podiatry

## 2021-06-21 ENCOUNTER — Ambulatory Visit: Payer: Medicare Other | Admitting: Podiatry

## 2022-09-09 ENCOUNTER — Encounter (INDEPENDENT_AMBULATORY_CARE_PROVIDER_SITE_OTHER): Payer: Self-pay

## 2022-11-12 ENCOUNTER — Telehealth: Payer: Self-pay | Admitting: Cardiology

## 2022-11-12 NOTE — Telephone Encounter (Signed)
LVM with patient in attempt to get patient scheduled for a new patient appointment with Dr. Aundra Dubin after receiving a referral from Dr. Marry Guan, NT

## 2022-11-13 ENCOUNTER — Telehealth: Payer: Self-pay | Admitting: Cardiology

## 2022-11-13 NOTE — Telephone Encounter (Signed)
I have called every number listed for patient in attempt to get a new patient appointment scheduled with Dr. Aundra Dubin after receiving a referral from Dr. Ginette Pitman office and have been unable to Reach patient.   Vihaan Gloss, NT

## 2022-11-25 ENCOUNTER — Ambulatory Visit: Payer: Medicare Other | Attending: Cardiology | Admitting: Cardiology

## 2022-11-25 ENCOUNTER — Encounter: Payer: Self-pay | Admitting: Cardiology

## 2022-11-25 ENCOUNTER — Other Ambulatory Visit
Admission: RE | Admit: 2022-11-25 | Discharge: 2022-11-25 | Disposition: A | Discharge status: Home / Self Care | Payer: Medicare Other | Source: Ambulatory Visit | Attending: Cardiology | Admitting: Cardiology

## 2022-11-25 VITALS — BP 104/60 | HR 83 | Ht 73.0 in | Wt 236.5 lb

## 2022-11-25 DIAGNOSIS — Z7901 Long term (current) use of anticoagulants: Secondary | ICD-10-CM | POA: Diagnosis not present

## 2022-11-25 DIAGNOSIS — N411 Chronic prostatitis: Secondary | ICD-10-CM | POA: Diagnosis not present

## 2022-11-25 DIAGNOSIS — I110 Hypertensive heart disease with heart failure: Secondary | ICD-10-CM | POA: Diagnosis not present

## 2022-11-25 DIAGNOSIS — I2510 Atherosclerotic heart disease of native coronary artery without angina pectoris: Secondary | ICD-10-CM

## 2022-11-25 DIAGNOSIS — I480 Paroxysmal atrial fibrillation: Secondary | ICD-10-CM | POA: Diagnosis not present

## 2022-11-25 DIAGNOSIS — Z79899 Other long term (current) drug therapy: Secondary | ICD-10-CM | POA: Insufficient documentation

## 2022-11-25 DIAGNOSIS — I10 Essential (primary) hypertension: Secondary | ICD-10-CM

## 2022-11-25 DIAGNOSIS — M79604 Pain in right leg: Secondary | ICD-10-CM

## 2022-11-25 DIAGNOSIS — I4892 Unspecified atrial flutter: Secondary | ICD-10-CM | POA: Diagnosis not present

## 2022-11-25 DIAGNOSIS — N401 Enlarged prostate with lower urinary tract symptoms: Secondary | ICD-10-CM

## 2022-11-25 DIAGNOSIS — M79605 Pain in left leg: Secondary | ICD-10-CM

## 2022-11-25 LAB — LIPID PANEL
Cholesterol: 205 mg/dL — ABNORMAL HIGH (ref 0–200)
HDL: 48 mg/dL (ref >40)
LDL Cholesterol: 105 mg/dL — ABNORMAL HIGH (ref 0–99)
Total CHOL/HDL Ratio: 4.3 RATIO
Triglycerides: 261 mg/dL — ABNORMAL HIGH (ref <150)
VLDL: 52 mg/dL — ABNORMAL HIGH (ref 0–40)

## 2022-11-25 LAB — BASIC METABOLIC PANEL
Anion gap: 8 (ref 5–15)
BUN: 34 mg/dL — ABNORMAL HIGH (ref 8–23)
CO2: 28 mmol/L (ref 22–32)
Calcium: 9.6 mg/dL (ref 8.9–10.3)
Chloride: 104 mmol/L (ref 98–111)
Creatinine, Ser: 0.87 mg/dL (ref 0.61–1.24)
GFR, Estimated: >60 mL/min (ref >60)
Glucose, Bld: 99 mg/dL (ref 70–99)
Potassium: 5 mmol/L (ref 3.5–5.1)
Sodium: 140 mmol/L (ref 135–145)

## 2022-11-25 NOTE — Patient Instructions (Addendum)
Medication Changes:  Continue taking all medications the same  Lab Work:  Labs done today, your results will be available in MyChart, we will contact you for abnormal readings.   Testing/Procedures:   Your physician has requested that you have an echocardiogram. Echocardiography is a painless test that uses sound waves to create images of your heart. It provides your doctor with information about the size and shape of your heart and how well your heart's chambers and valves are working. This procedure takes approximately one hour. There are no restrictions for this procedure. Please do NOT wear cologne, perfume, aftershave, or lotions (deodorant is allowed). Please arrive 15 minutes prior to your appointment time. We will call you to schedule this test.    Referrals: A referral has been placed with [facility], electrophysiology department for your heart rhythm. Their office will call you to schedule an appointment.    Special Instructions // Education:  Do the following things EVERYDAY: Weigh yourself in the morning before breakfast. Write it down and keep it in a log. Take your medicines as prescribed Eat low salt foods--Limit salt (sodium) to 2000 mg per day.  Stay as active as you can everyday Limit all fluids for the day to less than 2 liters   Follow-Up in: 1 year with the Heart Failure Clinic     If you have any questions or concerns before your next appointment please send [place] a message through mychart or call our office at [phone] Monday-Friday 8 am-5 pm.   If you have an urgent need after hours on the weekend please call your Primary Cardiologist or the [facility] in [place] at [phone].

## 2022-11-25 NOTE — Progress Notes (Signed)
ADVANCED HEART FAILURE CLINIC NOTE  Referring Physician: Tracie Harrier, MD  Primary Care: Tracie Harrier, MD  HPI: Shane Lamb is a 72 y.o. 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 Mr. Shane Lamb reports that the only cardiac history he is aware of is a diagnosis of paroxysmal atrial fibrillation previously followed at [facility]. Based on chart review, Mr. Shane Lamb has been diagnosed with atrial fibrillation/atrial flutter dating back to at least 2000. At that time he underwent AFL ablation at [facility] In addition, he has undergone PVI/CSI AF ablation in 2012 and a surgical epicardial ablation for atrial fibrillation in 2017. He was previously on rhythm control with Tikosyn that he self-discontinued and since that time has been on rate control with diltiazem.   From a functional standpoint, Mr. Shane Lamb reports no limitations. He is only limited by osteoarthritis of both knees but no reported SOB, CP, lightheadedness or palpitations at rest or with exertion.    Past Medical History:  Diagnosis Date   Atrial fibrillation (HCC)    Hypertension    Prostatitis, chronic     Current Outpatient Medications  Medication Sig Dispense Refill   acetaminophen (TYLENOL) 325 MG tablet Take 650 mg by mouth every 6 (six) hours as needed.     apixaban (ELIQUIS) 5 MG TABS tablet Take 5 mg by mouth 2 (two) times daily.     B Complex-C (B-COMPLEX WITH VITAMIN C) tablet Take 1 tablet by mouth daily.     carboxymethylcellulose (REFRESH PLUS) 0.5 % SOLN Place 1 drop into both eyes 3 (three) times daily as needed.     Cholecalciferol 50 MCG (2000 UT) CAPS Take by mouth.     cyclobenzaprine (FLEXERIL) 10 MG tablet Take 1 tablet by mouth 3 (three) times daily as needed.     diltiazem (CARDIZEM CD) 360 MG 24 hr capsule Take 360 mg by mouth daily.     FLUoxetine (PROZAC) 40 MG capsule Take 40 mg by mouth daily.      HYDROcodone-acetaminophen (NORCO) 10-325 MG tablet Take 1 tablet by mouth 2 (two) times daily as needed for severe pain.     lisinopril (ZESTRIL) 2.5 MG tablet Take 2.5 mg by mouth daily.     Magnesium 200 MG TABS Take 2 tablets by mouth daily.     ofloxacin (FLOXIN) 0.3 % OTIC solution Place 5 drops into both ears daily.     Omega-3 1000 MG CAPS Take by mouth.     sildenafil (VIAGRA) 100 MG tablet Take 100 mg by mouth daily as needed for erectile dysfunction.     tadalafil (CIALIS) 10 MG tablet Take by mouth.     tamsulosin (FLOMAX) 0.4 MG CAPS capsule Take 0.4 mg by mouth daily.     zolpidem (AMBIEN) 5 MG tablet Take 5 mg by mouth at bedtime as needed for sleep.     No current facility-administered medications for this visit.    Allergies  Allergen Reactions   Amiodarone Nausea And Vomiting      Social History   Socioeconomic History   Marital status: Married    Spouse name: Not on file   Number of children: Not on file   Years of education: Not on file   Highest education level: Not on file  Occupational History   Occupation: retired  Tobacco Use   Smoking status: Former    Types: Cigarettes    Quit date: 1975    Years since quitting: 7.0  Smokeless tobacco: Former    Quit date: 1974  Substance and Sexual Activity   Alcohol use: Yes   Drug use: No   Sexual activity: Yes  Other Topics Concern   Not on file  Social History Narrative   Not on file   Social Determinants of Health   Financial Resource Strain: Not on file  Food Insecurity: Not on file  Transportation Needs: Not on file  Physical Activity: Not on file  Stress: Not on file  Social Connections: Not on file  Intimate Partner Violence: Not on file      Family History  Problem Relation Age of Onset   Hypertension Father    Depression Father     PHYSICAL EXAM: Vitals:   11/25/22 1146  Pulse: 83  SpO2: 95%   GENERAL: Well nourished, well developed, and in no apparent distress at rest.   HEENT: Negative for arcus senilis or xanthelasma. There is no scleral icterus.  The mucous membranes are pink and moist.   NECK: Supple, No masses. Normal carotid upstrokes without bruits. No masses or thyromegaly.    CHEST: There are no chest wall deformities. There is no chest wall tenderness. Respirations are unlabored.  Lungs- cta b/l CARDIAC:  JVP: 7 cm H2O         Normal S1, S2  Normal rate with irregular rhythm. No murmurs, rubs or gallops.  Pulses are 2+ and symmetrical in upper and lower extremities. no edema.  ABDOMEN: Soft, non-tender, non-distended. There are no masses or hepatomegaly. There are normal bowel sounds.  EXTREMITIES: Warm and well perfused with no cyanosis, clubbing.  LYMPHATIC: No axillary or supraclavicular lymphadenopathy.  NEUROLOGIC: Patient is oriented x3 with no focal or lateralizing neurologic deficits.  PSYCH: Patients affect is appropriate, there is no evidence of anxiety or depression.  SKIN: Warm and dry; no lesions or wounds.   DATA REVIEW  NKN:[id number].   ECHO: 06/25/21 ([facility]): LVEF >55%, mild LVH 06/05/20 ([facility]): LVEF >55%, normal RV function.  Myocardial perfusion / Lexiscan ([facility]): (06/05/2020) SPECT images demonstrate homogeneous tracer distribution throughout the myocardium. Defect type:  Normal    ASSESSMENT & PLAN:  Atrial fibrillation - Reports that he has paroxysmal atrial fibrillation  - As per chart review, hx of AFL ablation in 10/1999 & atrial fibrillation ablation (PVI & CSI) in 2012.  - Surgical epicardial ablation in 06/2016 - Previously on Tikosyn but now rate controlled with diltiazem.  - Continue apixaban 5mg  BID.  - EKG today; Ziopatch to assess burden of atrial fibrillation.  - EP follow up, patient interested in resuming anti-arrhythmics if Ziopatch shows significant burden of atrial fibrillation.   2. CDL Clearance - From a cardiac standpoint, Mr. Shane Lamb does not appear to have any limitations. He reports no chest pain  or shortness of breath with exertion. His last LV EF was normal as noted above. 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   3. HTN - Continue lisinopril, followed by his PCP - BMP - lipid panel    Ettel Albergo Advanced Heart Failure Mechanical Circulatory Support

## 2022-12-04 ENCOUNTER — Telehealth: Payer: Self-pay | Admitting: Family

## 2022-12-04 NOTE — Telephone Encounter (Signed)
Patient called upset to both our clinic and heart care stating he spoke to someone this morning and they were going to move his echo to Friday. I called personally to both heart care locations and asked staff In the Heart failure clinic office as well as scheduling department for echos and everyone stated the same thing that they have not spoke to patient and that there is no availability for Friday for an echo. I called and lvm with patient stating this information and gave him the number to scheduling if he would like to get another date but they are booked over a month out.    Aragorn Recker, NT

## 2022-12-06 ENCOUNTER — Encounter (HOSPITAL_COMMUNITY): Payer: Self-pay

## 2022-12-09 ENCOUNTER — Ambulatory Visit
Admission: RE | Admit: 2022-12-09 | Discharge: 2022-12-09 | Disposition: A | Discharge status: Home / Self Care | Payer: Medicare Other | Source: Ambulatory Visit | Attending: Cardiology | Admitting: Cardiology

## 2022-12-09 DIAGNOSIS — I4891 Unspecified atrial fibrillation: Secondary | ICD-10-CM | POA: Diagnosis present

## 2022-12-09 DIAGNOSIS — I509 Heart failure, unspecified: Secondary | ICD-10-CM | POA: Diagnosis not present

## 2022-12-09 DIAGNOSIS — I480 Paroxysmal atrial fibrillation: Secondary | ICD-10-CM

## 2022-12-09 DIAGNOSIS — I119 Hypertensive heart disease without heart failure: Secondary | ICD-10-CM | POA: Diagnosis not present

## 2022-12-09 DIAGNOSIS — I080 Rheumatic disorders of both mitral and aortic valves: Secondary | ICD-10-CM | POA: Diagnosis not present

## 2022-12-09 LAB — ECHOCARDIOGRAM COMPLETE
AR max vel: 3.03 cm2
AV Area VTI: 4.27 cm2
AV Area mean vel: 3.77 cm2
AV Mean grad: 2 mmHg
AV Peak grad: 4.8 mmHg
Ao pk vel: 1.09 m/s
Area-P 1/2: 3.81 cm2
S' Lateral: 3 cm

## 2022-12-09 NOTE — Progress Notes (Signed)
*  PRELIMINARY RESULTS* Echocardiogram 2D Echocardiogram has been performed.  Sherrie Sport 12/09/2022, 9:18 AM

## 2022-12-11 ENCOUNTER — Ambulatory Visit: Payer: Medicare Other

## 2022-12-12 ENCOUNTER — Encounter (HOSPITAL_COMMUNITY): Payer: Self-pay | Admitting: *Deleted

## 2022-12-12 ENCOUNTER — Encounter: Payer: Self-pay | Admitting: *Deleted

## 2022-12-24 NOTE — Progress Notes (Unsigned)
Electrophysiology Office Note:    Date:  12/25/2022   ID:  Shane Lamb, DOB 09/20/1951, MRN [id number]  PCP:  Tracie Harrier, MD  [facility] Cardiologist:  None  [facility] Electrophysiologist:  Vickie Epley, MD   Referring MD: Hebert Soho, DO   Chief Complaint: AF  History of Present Illness:    Shane Lamb is a 72 y.o. male who presents for an evaluation of AF at the request of Dr Daniel Nones. Their medical history includes pAF, HTN. He was seen by Dr Daniel Nones on 11/25/2022. He was diagnosed with AF/AFL around 2000 at [facility]. He had a flutter ablation followed by a catheter ablation in 2012 followed by a surgical ablation in 2017. He was previously on dofetilide but uthen stopped this before pursuing a rate control strategy.  Today he tells me that he is doing okay.  He is interested in getting more active and starting exercise again.  He stopped taking his Tikosyn back in 2017 and has been out of rhythm since that time.  He was previously cared for at [facility] in [place].       Past Medical History:  Diagnosis Date   Atrial fibrillation ([place])    Hypertension    Prostatitis, chronic     Past Surgical History:  Procedure Laterality Date   CARDIAC ELECTROPHYSIOLOGY MAPPING AND ABLATION     JOINT REPLACEMENT     hip replacement   REPLACEMENT TOTAL KNEE      Current Medications: Current Meds  Medication Sig   acetaminophen (TYLENOL) 325 MG tablet Take 650 mg by mouth every 6 (six) hours as needed.   ALPRAZolam (XANAX) 1 MG tablet Take 1 mg by mouth at bedtime as needed for sleep.   apixaban (ELIQUIS) 5 MG TABS tablet Take 5 mg by mouth 2 (two) times daily.   B Complex-C (B-COMPLEX WITH VITAMIN C) tablet Take 1 tablet by mouth daily.   carboxymethylcellulose (REFRESH PLUS) 0.5 % SOLN Place 1 drop into both eyes 3 (three) times daily as needed.   Cholecalciferol 50 MCG (2000 UT) CAPS Take by mouth.   cyclobenzaprine (FLEXERIL) 10 MG tablet Take 1  tablet by mouth 3 (three) times daily as needed.   diltiazem (CARDIZEM CD) 360 MG 24 hr capsule Take 360 mg by mouth daily.   FLUoxetine (PROZAC) 40 MG capsule Take 40 mg by mouth daily.   HYDROcodone-acetaminophen (NORCO) 10-325 MG tablet Take 1 tablet by mouth 2 (two) times daily as needed for severe pain.   lisinopril (ZESTRIL) 2.5 MG tablet Take 2.5 mg by mouth daily.   Magnesium 200 MG TABS Take 2 tablets by mouth daily.   ofloxacin (FLOXIN) 0.3 % OTIC solution Place 5 drops into both ears daily.   Omega-3 1000 MG CAPS Take by mouth.   sildenafil (VIAGRA) 100 MG tablet Take 100 mg by mouth daily as needed for erectile dysfunction.   tadalafil (CIALIS) 10 MG tablet Take by mouth.   tamsulosin (FLOMAX) 0.4 MG CAPS capsule Take 0.4 mg by mouth daily.   zolpidem (AMBIEN) 5 MG tablet Take 5 mg by mouth at bedtime as needed for sleep.     Allergies:   Amiodarone   Social History   Socioeconomic History   Marital status: Married    Spouse name: Not on file   Number of children: Not on file   Years of education: Not on file   Highest education level: Not on file  Occupational History   Occupation: retired  Tobacco Use   Smoking status: Former    Types: Cigarettes    Quit date: 1975    Years since quitting: 49.1   Smokeless tobacco: Former    Quit date: 1974  Substance and Sexual Activity   Alcohol use: Yes   Drug use: No   Sexual activity: Yes  Other Topics Concern   Not on file  Social History Narrative   Not on file   Social Determinants of Health   Financial Resource Strain: Not on file  Food Insecurity: Not on file  Transportation Needs: Not on file  Physical Activity: Not on file  Stress: Not on file  Social Connections: Not on file     Family History: The patient's family history includes Depression in his father; Hypertension in his father.  ROS:   Please see the history of present illness.    All other systems reviewed and are negative.  EKGs/Labs/Other  Studies Reviewed:    The following studies were reviewed today:  11/25/2022 ECG shows AFL, rate controlled  12/09/2022 Echo EF 55 RV normal Mild-mod MR  EKG today shows atrial flutter with variable AV block and a ventricular rate of 82 bpm.   Recent Labs: 11/25/2022: BUN 34; Creatinine, Ser 0.87; Potassium 5.0; Sodium 140  Recent Lipid Panel    Component Value Date/Time   CHOL 205 (H) 11/25/2022 1250   TRIG 261 (H) 11/25/2022 1250   HDL 48 11/25/2022 1250   CHOLHDL 4.3 11/25/2022 1250   VLDL 52 (H) 11/25/2022 1250   LDLCALC 105 (H) 11/25/2022 1250    Physical Exam:    VS:  BP 114/70   Pulse 82   Ht 6' 1"$  (1.854 m)   Wt 239 lb (108.4 kg)   SpO2 96%   BMI 31.53 kg/m     Wt Readings from Last 3 Encounters:  12/25/22 239 lb (108.4 kg)  11/25/22 236 lb 8 oz (107.3 kg)  12/13/20 266 lb (120.7 kg)     GEN:  Well nourished, well developed in no acute distress CARDIAC: RRR, no murmurs, rubs, gallops RESPIRATORY:  Clear to auscultation without rales, wheezing or rhonchi       ASSESSMENT:    1. Paroxysmal atrial fibrillation (HCC)   2. Primary hypertension    PLAN:    In order of problems listed above:  #Permanent AF Extensive ablation history including a surgical ablation. He has been on dofetilide but self-discontinued. On eliquis for stroke ppx.  I do not think there is utility at this point for redo catheter ablation given low chance of maintaining sinus rhythm.  #Hypertension At goal today.  Recommend checking blood pressures 1-2 times per week at home and recording the values.  Recommend bringing these recordings to the primary care physician.   Follow up with EP as needed.    Medication Adjustments/Labs and Tests Ordered: Current medicines are reviewed at length with the patient today.  Concerns regarding medicines are outlined above.  No orders of the defined types were placed in this encounter.  No orders of the defined types were placed in this  encounter.    Signed, Hilton Cork. Quentin Ore, MD, [facility], [facility] 12/25/2022 11:54 AM    Electrophysiology [facility]

## 2022-12-25 ENCOUNTER — Ambulatory Visit: Payer: Medicare Other | Attending: Cardiology | Admitting: Cardiology

## 2022-12-25 ENCOUNTER — Encounter: Payer: Self-pay | Admitting: Cardiology

## 2022-12-25 VITALS — BP 114/70 | HR 82 | Ht 73.0 in | Wt 239.0 lb

## 2022-12-25 DIAGNOSIS — I10 Essential (primary) hypertension: Secondary | ICD-10-CM | POA: Diagnosis not present

## 2022-12-25 DIAGNOSIS — I480 Paroxysmal atrial fibrillation: Secondary | ICD-10-CM | POA: Diagnosis not present

## 2022-12-25 NOTE — Patient Instructions (Signed)
Medication Instructions:  Your physician recommends that you continue on your current medications as directed. Please refer to the Current Medication list given to you today.  *If you need a refill on your cardiac medications before your next appointment, please call your pharmacy*  Follow-Up: At [facility], you and your health needs are our priority.  As part of our continuing mission to provide you with exceptional heart care, we have created designated Provider Care Teams.  These Care Teams include your primary Cardiologist (physician) and Advanced Practice Providers (APPs -  Physician Assistants and Nurse Practitioners) who all work together to provide you with the care you need, when you need it.  Your next appointment:   As needed with Dr. Quentin Ore

## 2022-12-26 ENCOUNTER — Encounter: Payer: Medicare Other | Admitting: Cardiology

## 2023-01-16 ENCOUNTER — Encounter: Payer: Self-pay | Admitting: Cardiology

## 2023-01-16 ENCOUNTER — Ambulatory Visit: Payer: Medicare Other | Attending: Cardiology | Admitting: Cardiology

## 2023-01-16 VITALS — BP 116/83 | HR 98 | Wt 240.8 lb

## 2023-01-16 DIAGNOSIS — Z7901 Long term (current) use of anticoagulants: Secondary | ICD-10-CM | POA: Diagnosis not present

## 2023-01-16 DIAGNOSIS — I10 Essential (primary) hypertension: Secondary | ICD-10-CM | POA: Insufficient documentation

## 2023-01-16 DIAGNOSIS — Z79899 Other long term (current) drug therapy: Secondary | ICD-10-CM | POA: Diagnosis not present

## 2023-01-16 DIAGNOSIS — I4819 Other persistent atrial fibrillation: Secondary | ICD-10-CM | POA: Insufficient documentation

## 2023-01-16 DIAGNOSIS — N411 Chronic prostatitis: Secondary | ICD-10-CM | POA: Diagnosis not present

## 2023-01-16 DIAGNOSIS — I480 Paroxysmal atrial fibrillation: Secondary | ICD-10-CM

## 2023-01-16 NOTE — Patient Instructions (Addendum)
Your provider requests you have a coronary calcium score CT.  Your appointment is scheduled for 01/28/2023 at 9:30 am please arrive at the [facility] by 9:15 am.   You have been referred to the afib clinic. Please call [phone] to schedule your appointment.   Follow up in 4 months with Dr.McLean

## 2023-01-16 NOTE — Progress Notes (Signed)
ADVANCED HEART FAILURE CLINIC NOTE  Referring Physician: Tracie Harrier, MD  Primary Care: Tracie Harrier, MD HF Cardiology: Dr. Daniel Nones  HPI: Shane Lamb is a 73 y.o. male with hypertension, persistent atrial fibrillation, chronic prostatitis, and osteoarthritis. Shane Lamb reports that the only cardiac history he is aware of is a diagnosis of atrial fibrillation previously followed at [facility]. Based on chart review, Shane Lamb has been diagnosed with atrial fibrillation/atrial flutter dating back to at least 2000. At that time he underwent AFL ablation at [facility]. In addition, he has undergone PVI/CSI AF ablation in 2012 and a surgical epicardial ablation for atrial fibrillation in 2017. He was previously on rhythm control with Tikosyn that he self-discontinued and since that time has been on rate control with diltiazem.  He appears to have been in atrial fibrillation since 2017.   Echo in 1/24 showed EF 55-60%, normal RV, mild-moderate MR.   He saw Dr. Quentin Ore in 2/24.  Dr. Quentin Ore did not think that there would be utility to repeat ablation due to low risk of maintaining NSR.   Patient returns for followup of atrial fibrillation. He denies palpitations.  Occasional lightheadedness with standing.  No significant exertional dyspena or chest pain.   ECG (personally reviewed): Coarse atrial fibrillation  Labs (1/24): LDL 105, K 5, creatinine 0.87   Past Medical History:  Diagnosis Date   Atrial fibrillation (HCC)    Hypertension    Prostatitis, chronic     Current Outpatient Medications  Medication Sig Dispense Refill   acetaminophen (TYLENOL) 325 MG tablet Take 650 mg by mouth every 6 (six) hours as needed.     ALPRAZolam (XANAX) 1 MG tablet Take 1 mg by mouth at bedtime as needed for sleep.     apixaban (ELIQUIS) 5 MG TABS tablet Take 5 mg by mouth 2 (two) times daily.     B Complex-C (B-COMPLEX WITH VITAMIN C) tablet Take 1 tablet by mouth daily.     Cholecalciferol 50  MCG (2000 UT) CAPS Take by mouth.     cyclobenzaprine (FLEXERIL) 10 MG tablet Take 1 tablet by mouth 3 (three) times daily as needed.     diltiazem (CARDIZEM CD) 360 MG 24 hr capsule Take 360 mg by mouth daily.     FLUoxetine (PROZAC) 40 MG capsule Take 40 mg by mouth daily.     HYDROcodone-acetaminophen (NORCO) 10-325 MG tablet Take 1 tablet by mouth 2 (two) times daily as needed for severe pain.     lisinopril (ZESTRIL) 2.5 MG tablet Take 2.5 mg by mouth daily.     Magnesium 200 MG TABS Take 2 tablets by mouth daily.     ofloxacin (FLOXIN) 0.3 % OTIC solution Place 5 drops into both ears daily.     Omega-3 1000 MG CAPS Take by mouth.     tamsulosin (FLOMAX) 0.4 MG CAPS capsule Take 0.4 mg by mouth daily.     carboxymethylcellulose (REFRESH PLUS) 0.5 % SOLN Place 1 drop into both eyes 3 (three) times daily as needed.     sildenafil (VIAGRA) 100 MG tablet Take 100 mg by mouth daily as needed for erectile dysfunction.     tadalafil (CIALIS) 10 MG tablet Take by mouth.     zolpidem (AMBIEN) 5 MG tablet Take 5 mg by mouth at bedtime as needed for sleep.     No current facility-administered medications for this visit.    Allergies  Allergen Reactions   Amiodarone Nausea And Vomiting  Social History   Socioeconomic History   Marital status: Married    Spouse name: Not on file   Number of children: Not on file   Years of education: Not on file   Highest education level: Not on file  Occupational History   Occupation: retired  Tobacco Use   Smoking status: Former    Types: Cigarettes    Quit date: 1975    Years since quitting: 49.2   Smokeless tobacco: Former    Quit date: 1974  Substance and Sexual Activity   Alcohol use: Yes   Drug use: No   Sexual activity: Yes  Other Topics Concern   Not on file  Social History Narrative   Not on file   Social Determinants of Health   Financial Resource Strain: Not on file  Food Insecurity: Not on file  Transportation Needs: Not  on file  Physical Activity: Not on file  Stress: Not on file  Social Connections: Not on file  Intimate Partner Violence: Not on file      Family History  Problem Relation Age of Onset   Hypertension Father    Depression Father     PHYSICAL EXAM: Vitals:   01/16/23 1128  BP: 116/83  Pulse: 98  SpO2: 96%   General: NAD Neck: No JVD, no thyromegaly or thyroid nodule.  Lungs: Clear to auscultation bilaterally with normal respiratory effort. CV: Nondisplaced PMI.  Heart irregular S1/S2, no S3/S4, no murmur.  No peripheral edema.  No carotid bruit.  Normal pedal pulses.  Abdomen: Soft, nontender, no hepatosplenomegaly, no distention.  Skin: Intact without lesions or rashes.  Neurologic: Alert and oriented x 3.  Psych: Normal affect. Extremities: No clubbing or cyanosis.  HEENT: Normal.   DATA REVIEW  ECHO: 06/25/21 ([facility]): LVEF >55%, mild LVH 06/05/20 ([facility]): LVEF >55%, normal RV function.  Myocardial perfusion / Lexiscan ([facility]): (06/05/2020) SPECT images demonstrate homogeneous tracer distribution throughout the myocardium. Defect type:  Normal    ASSESSMENT & PLAN:  1. Atrial fibrillation: As per chart review, hx of AFL ablation in 10/1999 & atrial fibrillation ablation (PVI & CSI) in 2012.  Surgical epicardial ablation in 06/2016.  Previously on Tikosyn but now rate controlled with diltiazem. Persistent AF since at 2019 based on ECGs in our system.  Coarse AF today on ECG. Saw Dr. Quentin Ore with EP, did not think that repeat ablation would be successful.  - Continue apixaban '5mg'$  BID.  - Patient asks about going back on Tikosyn.  I do not think this would be unreasonable.  I would be willing to refer him to AF clinic to get set up for [facility] admission with cardioversion prior to discharge.  He wants to think about this and will get back to [place] regarding his decision.  2. HTN: BP is controlled.  3. CAD risk: I will arrange for coronary artery calcium score scan.  If risk  elevated from calcium score, would suggest statin.   Loralie Champagne 01/17/2023

## 2023-01-28 ENCOUNTER — Ambulatory Visit
Admission: RE | Admit: 2023-01-28 | Discharge: 2023-01-28 | Disposition: A | Discharge status: Home / Self Care | Payer: Medicare Other | Source: Ambulatory Visit | Attending: Cardiology | Admitting: Cardiology

## 2023-01-28 DIAGNOSIS — I480 Paroxysmal atrial fibrillation: Secondary | ICD-10-CM | POA: Insufficient documentation

## 2023-02-04 ENCOUNTER — Encounter (HOSPITAL_COMMUNITY): Payer: Self-pay

## 2023-02-12 ENCOUNTER — Telehealth: Payer: Self-pay

## 2023-02-12 DIAGNOSIS — I2510 Atherosclerotic heart disease of native coronary artery without angina pectoris: Secondary | ICD-10-CM

## 2023-02-12 MED ORDER — ROSUVASTATIN CALCIUM 10 MG PO TABS: 10.0000 mg | ORAL_TABLET | Freq: Every day | ORAL | 3 refills | Status: DC

## 2023-02-12 NOTE — Telephone Encounter (Signed)
Attempted to call pt with no answer. Left vm.with a call back number. Order for  Rosuvastatin 10 mg daily placed. Labs work for lipids/LFTs placed.  Per Dr.McLean Calcium score suggests intermediate risk of cardiac events. To prevent, would start statin to decrease deposition of plaque in his arteries.  He mentioned wanting to start pravastatin.  That is an option, but rosuvastatin is a newer agent that is more effective with less side effects.  Would suggest rosuvastatin 10 mg daily and check lipids/LFTs in 2 months.

## 2023-02-13 NOTE — Telephone Encounter (Signed)
Pt returned call he is aware of calcium scoring CT results and medication change.

## 2023-02-14 ENCOUNTER — Telehealth (HOSPITAL_COMMUNITY): Payer: Self-pay | Admitting: *Deleted

## 2023-02-14 NOTE — Telephone Encounter (Addendum)
-----   Message from Lanier Prude, MD sent at 02/09/2023  2:58 PM EDT ----- I would not recommend tikosyn. Has been out of rhythm since 2017. I called him permament at the last visit. CL  ----- Message ----- From: Shona Simpson, RN Sent: 02/04/2023   9:08 AM EDT To: Lanier Prude, MD  OK to schedule for Tikosyn admission?    Above forwarded to Dr. Shirlee Latch as well.

## 2023-04-08 ENCOUNTER — Encounter: Payer: Medicare Other | Admitting: Cardiology

## 2023-04-23 DIAGNOSIS — I10 Essential (primary) hypertension: Secondary | ICD-10-CM | POA: Diagnosis not present

## 2023-04-23 DIAGNOSIS — Z9889 Other specified postprocedural states: Secondary | ICD-10-CM | POA: Diagnosis not present

## 2023-04-23 DIAGNOSIS — Z1331 Encounter for screening for depression: Secondary | ICD-10-CM | POA: Diagnosis not present

## 2023-04-23 DIAGNOSIS — F339 Major depressive disorder, recurrent, unspecified: Secondary | ICD-10-CM | POA: Diagnosis not present

## 2023-04-23 DIAGNOSIS — Z0000 Encounter for general adult medical examination without abnormal findings: Secondary | ICD-10-CM | POA: Diagnosis not present

## 2023-04-23 DIAGNOSIS — Z6832 Body mass index (BMI) 32.0-32.9, adult: Secondary | ICD-10-CM | POA: Diagnosis not present

## 2023-04-23 DIAGNOSIS — E669 Obesity, unspecified: Secondary | ICD-10-CM | POA: Diagnosis not present

## 2023-04-23 DIAGNOSIS — G4733 Obstructive sleep apnea (adult) (pediatric): Secondary | ICD-10-CM | POA: Diagnosis not present

## 2023-04-23 DIAGNOSIS — M1711 Unilateral primary osteoarthritis, right knee: Secondary | ICD-10-CM | POA: Diagnosis not present

## 2023-04-30 ENCOUNTER — Telehealth: Payer: Self-pay

## 2023-04-30 NOTE — Telephone Encounter (Signed)
Spoke with patient to schedule his follow-up appointment with Dr. Shirlee Latch. Patient also gave updated insurance information. [employer] Gold [employer] Member ID #:[id number] with telephone number: [phone]

## 2023-05-09 DIAGNOSIS — M1711 Unilateral primary osteoarthritis, right knee: Secondary | ICD-10-CM | POA: Diagnosis not present

## 2023-05-12 DIAGNOSIS — M79675 Pain in left toe(s): Secondary | ICD-10-CM | POA: Diagnosis not present

## 2023-05-12 DIAGNOSIS — B351 Tinea unguium: Secondary | ICD-10-CM | POA: Diagnosis not present

## 2023-05-12 DIAGNOSIS — M79674 Pain in right toe(s): Secondary | ICD-10-CM | POA: Diagnosis not present

## 2023-05-19 DIAGNOSIS — F331 Major depressive disorder, recurrent, moderate: Secondary | ICD-10-CM | POA: Diagnosis not present

## 2023-05-22 ENCOUNTER — Encounter: Payer: Self-pay | Admitting: Cardiology

## 2023-05-22 ENCOUNTER — Other Ambulatory Visit
Admission: RE | Admit: 2023-05-22 | Discharge: 2023-05-22 | Disposition: A | Discharge status: Home / Self Care | Payer: Medicare HMO | Source: Ambulatory Visit | Attending: Cardiology | Admitting: Cardiology

## 2023-05-22 ENCOUNTER — Other Ambulatory Visit (HOSPITAL_COMMUNITY): Payer: Self-pay

## 2023-05-22 ENCOUNTER — Ambulatory Visit: Payer: Medicare HMO | Admitting: Cardiology

## 2023-05-22 VITALS — BP 114/83 | HR 91 | Wt 257.2 lb

## 2023-05-22 DIAGNOSIS — I5032 Chronic diastolic (congestive) heart failure: Secondary | ICD-10-CM | POA: Diagnosis not present

## 2023-05-22 DIAGNOSIS — I2510 Atherosclerotic heart disease of native coronary artery without angina pectoris: Secondary | ICD-10-CM

## 2023-05-22 DIAGNOSIS — I480 Paroxysmal atrial fibrillation: Secondary | ICD-10-CM | POA: Diagnosis not present

## 2023-05-22 DIAGNOSIS — I110 Hypertensive heart disease with heart failure: Secondary | ICD-10-CM | POA: Diagnosis not present

## 2023-05-22 DIAGNOSIS — R0602 Shortness of breath: Secondary | ICD-10-CM | POA: Diagnosis not present

## 2023-05-22 LAB — BASIC METABOLIC PANEL
Anion gap: 10 (ref 5–15)
BUN: 27 mg/dL — ABNORMAL HIGH (ref 8–23)
CO2: 22 mmol/L (ref 22–32)
Calcium: 9.2 mg/dL (ref 8.9–10.3)
Chloride: 104 mmol/L (ref 98–111)
Creatinine, Ser: 1.02 mg/dL (ref 0.61–1.24)
GFR, Estimated: >60 mL/min (ref >60)
Glucose, Bld: 114 mg/dL — ABNORMAL HIGH (ref 70–99)
Potassium: 4 mmol/L (ref 3.5–5.1)
Sodium: 136 mmol/L (ref 135–145)

## 2023-05-22 LAB — BRAIN NATRIURETIC PEPTIDE: B Natriuretic Peptide: 52.4 pg/mL (ref 0.0–100.0)

## 2023-05-22 MED ORDER — LISINOPRIL 5 MG PO TABS: 5.0000 mg | ORAL_TABLET | Freq: Every day | ORAL | 3 refills | Status: AC

## 2023-05-22 MED ORDER — FUROSEMIDE 20 MG PO TABS: 20.0000 mg | ORAL_TABLET | Freq: Every day | ORAL | 3 refills | Status: DC

## 2023-05-22 MED ORDER — POTASSIUM CHLORIDE ER 10 MEQ PO TBCR: 10.0000 meq | EXTENDED_RELEASE_TABLET | Freq: Every day | ORAL | 3 refills | Status: DC

## 2023-05-22 MED ORDER — ROSUVASTATIN CALCIUM 10 MG PO TABS: 10.0000 mg | ORAL_TABLET | Freq: Every day | ORAL | 3 refills | Status: DC

## 2023-05-22 MED ORDER — DAPAGLIFLOZIN PROPANEDIOL 10 MG PO TABS: 10.0000 mg | ORAL_TABLET | Freq: Every day | ORAL | 6 refills | Status: DC

## 2023-05-22 MED ORDER — LISINOPRIL 5 MG PO TABS: 5.0000 mg | ORAL_TABLET | Freq: Every day | ORAL | 3 refills | Status: DC

## 2023-05-22 MED ORDER — EMPAGLIFLOZIN 10 MG PO TABS: 10.0000 mg | ORAL_TABLET | Freq: Every day | ORAL | 6 refills | Status: DC

## 2023-05-22 NOTE — Progress Notes (Signed)
ADVANCED HEART FAILURE CLINIC NOTE  Referring Physician: Barbette Reichmann, MD  Primary Care: Barbette Reichmann, MD HF Cardiology: Dr. Gasper Lloyd  HPI: Shane Lamb is a 72 y.o. male with hypertension, persistent atrial fibrillation, chronic prostatitis, and osteoarthritis. Shane Lamb reports that the only cardiac history he is aware of is a diagnosis of atrial fibrillation previously followed at [facility]. Based on chart review, Shane Lamb has been diagnosed with atrial fibrillation/atrial flutter dating back to at least 2000. At that time he underwent AFL ablation at [facility]. In addition, he has undergone PVI/CSI AF ablation in 2012 and a surgical epicardial ablation for atrial fibrillation in 2017. He was previously on rhythm control with Tikosyn that he self-discontinued and since that time has been on rate control with diltiazem.  He appears to have been in atrial fibrillation since 2017.   Echo in 1/24 showed EF 55-60%, normal RV, mild-moderate MR.   He saw Dr. Lalla Brothers in 2/24 for EP evaluation.  Dr. Lalla Brothers did not think that there would be utility to repeat ablation due to low risk of maintaining NSR.  He also was interested in trying Tikosyn again, but Dr. Lalla Brothers recommended against this.   Calcium score scan 3/24 with CAC 261 Agatston units, 55th percentile.   Patient returns for followup of atrial fibrillation. He denies palpitations.  He remains in coarse AF today.  BP not elevated today, but he reports SBP around 140 at home. He used to take lisinopril in the past but stopped for unclear reasons.  He is using CPAP.  He has had no chest pain.  He denies exertional dyspnea.  Walks with cane for balance.  He remembers 1 episode of orthopnea in the last few weeks. His weight is up 17 lbs and his legs have been swelling.   ECG (personally reviewed): Coarse atrial fibrillation  Labs (1/24): LDL 105, K 5, creatinine 0.87 Labs (4/24): K 4.7, creatinine 107   Past Medical History:   Diagnosis Date   Atrial fibrillation (HCC)    Hypertension    Prostatitis, chronic     Current Outpatient Medications  Medication Sig Dispense Refill   acetaminophen (TYLENOL) 325 MG tablet Take 650 mg by mouth every 6 (six) hours as needed.     apixaban (ELIQUIS) 5 MG TABS tablet Take 5 mg by mouth 2 (two) times daily.     B Complex-C (B-COMPLEX WITH VITAMIN C) tablet Take 1 tablet by mouth daily.     Cholecalciferol 50 MCG (2000 UT) CAPS Take by mouth.     diltiazem (CARDIZEM CD) 360 MG 24 hr capsule Take 360 mg by mouth daily.     HYDROcodone-acetaminophen (NORCO) 10-325 MG tablet Take 1 tablet by mouth 2 (two) times daily as needed for severe pain.     Magnesium 200 MG TABS Take 2 tablets by mouth daily.     Omega-3 1000 MG CAPS Take by mouth.     sildenafil (VIAGRA) 100 MG tablet Take 100 mg by mouth daily as needed for erectile dysfunction.     tamsulosin (FLOMAX) 0.4 MG CAPS capsule Take 0.4 mg by mouth daily.     ALPRAZolam (XANAX) 1 MG tablet Take 1 mg by mouth at bedtime as needed for sleep. (Patient not taking: Reported on 05/22/2023)     carboxymethylcellulose (REFRESH PLUS) 0.5 % SOLN Place 1 drop into both eyes 3 (three) times daily as needed. (Patient not taking: Reported on 05/22/2023)     cyclobenzaprine (FLEXERIL) 10 MG tablet Take 1  tablet by mouth 3 (three) times daily as needed. (Patient not taking: Reported on 05/22/2023)     empagliflozin (JARDIANCE) 10 MG TABS tablet Take 1 tablet (10 mg total) by mouth daily before breakfast. 30 tablet 6   FLUoxetine (PROZAC) 40 MG capsule Take 40 mg by mouth daily. (Patient not taking: Reported on 05/22/2023)     furosemide (LASIX) 20 MG tablet Take 1 tablet (20 mg total) by mouth daily. 90 tablet 3   lisinopril (ZESTRIL) 5 MG tablet Take 1 tablet (5 mg total) by mouth daily. 90 tablet 3   ofloxacin (FLOXIN) 0.3 % OTIC solution Place 5 drops into both ears daily. (Patient not taking: Reported on 05/22/2023)     potassium chloride  (KLOR-CON) 10 MEQ tablet Take 1 tablet (10 mEq total) by mouth daily. 90 tablet 3   rosuvastatin (CRESTOR) 10 MG tablet Take 1 tablet (10 mg total) by mouth daily. 90 tablet 3   tadalafil (CIALIS) 10 MG tablet Take by mouth. (Patient not taking: Reported on 05/22/2023)     zolpidem (AMBIEN) 5 MG tablet Take 5 mg by mouth at bedtime as needed for sleep. (Patient not taking: Reported on 05/22/2023)     No current facility-administered medications for this visit.    Allergies  Allergen Reactions   Amiodarone Nausea And Vomiting      Social History   Socioeconomic History   Marital status: Married    Spouse name: Not on file   Number of children: Not on file   Years of education: Not on file   Highest education level: Not on file  Occupational History   Occupation: retired  Tobacco Use   Smoking status: Former    Current packs/day: 0.00    Types: Cigarettes    Quit date: 1975    Years since quitting: 49.5   Smokeless tobacco: Former    Quit date: 1974  Substance and Sexual Activity   Alcohol use: Yes   Drug use: No   Sexual activity: Yes  Other Topics Concern   Not on file  Social History Narrative   Not on file   Social Determinants of Health   Financial Resource Strain: Low Risk  (04/23/2023)   Received from [facility], [facility]   Overall Financial Resource Strain (CARDIA)    Difficulty of Paying Living Expenses: Not hard at all  Food Insecurity: No Food Insecurity (04/23/2023)   Received from [facility], [facility]   Hunger Vital Sign    Worried About Running Out of Food in the Last Year: Never true    Ran Out of Food in the Last Year: Never true  Transportation Needs: No Transportation Needs (04/23/2023)   Received from [facility], [facility]   PRAPARE - Transportation    In the past 12 months, has lack of transportation kept you from medical  appointments or from getting medications?: No    Lack of Transportation (Non-Medical): No  Physical Activity: Not on file  Stress: Not on file  Social Connections: Not on file  Intimate Partner Violence: Not on file      Family History  Problem Relation Age of Onset   Hypertension Father    Depression Father     PHYSICAL EXAM: Vitals:   05/22/23 1145  BP: 114/83  Pulse: 91  SpO2: 94%   General: NAD Neck: JVP 8-9 cm with HJR, no thyromegaly or thyroid nodule.  Lungs: Clear  to auscultation bilaterally with normal respiratory effort. CV: Nondisplaced PMI.  Heart irregular S1/S2, no S3/S4, no murmur.  2+ edema 1/2 to knees bilaterally.  No carotid bruit.  Normal pedal pulses.  Abdomen: Soft, nontender, no hepatosplenomegaly, no distention.  Skin: Intact without lesions or rashes.  Neurologic: Alert and oriented x 3.  Psych: Normal affect. Extremities: No clubbing or cyanosis.  HEENT: Normal.   DATA REVIEW  ECHO: 06/25/21 ([facility]): LVEF >55%, mild LVH 06/05/20 ([facility]): LVEF >55%, normal RV function. 1/24:  Echo in 1/24 showed EF 55-60%, normal RV, mild-moderate MR.  Myocardial perfusion / Lexiscan ([facility]): (06/05/2020) SPECT images demonstrate homogeneous tracer distribution throughout the myocardium. Defect type:  Normal   ABIs normal in 2/22   ASSESSMENT & PLAN:  1. Atrial fibrillation: As per chart review, hx of atrial flutter ablation in 10/1999 & atrial fibrillation ablation (PVI & CSI) in 2012.  Surgical epicardial ablation in 06/2016.  Previously on Tikosyn but stopped in the past. Persistent AF since at least 2019 based on ECGs in our system.  Coarse AF today on ECG. Saw Dr. Lalla Brothers with EP, did not think that repeat ablation would be successful. Patient also asked about trying Tikosyn again.  I spoke with Dr. Lalla Brothers about this as well and he did not think this was likely to be successful given size of atria and the amount of time he has been in atrial fibrillation.  I  think that atrial fibrillation is permanent at this point.  - Continue apixaban 5mg  BID.  - Patient still interested in trying Tikosyn again and having another ablation. He wants to talk with EP again.  He does not want to go back to [facility] and plans to talk with EP at [facility].  - Rate control is adequate with diltiazem CD.  2. HTN: BP elevated at home. - Would restart lisinopril 5 mg daily.  3. CAD:  Calcium score 261 Agatston units in 3/24.  No chest pain.  - I recommended that he start Crestor 10 mg daily. Check lipids/LFTs in 2 months.  - No ASA given apixaban use.  4. Chronic diastolic CHF:  Last echo in 1/24 showed EF 55-60%, normal RV, mild-moderate MR.  Patient has gained 17 lbs since I saw him last and has developed significant peripheral edema along with JVD.  However, he is minimally symptomatic (NYHA class II).   - Will repeat echo to reassess LV and RV function.  - Add Lasix 20 mg daily with KCl 10 daily.  BMET/BNP today, BMET in 10 days.  - Start Farxiga 10 mg daily.  5. OSA: Continue CPAP.   Marca Ancona 05/22/2023

## 2023-05-22 NOTE — Addendum Note (Signed)
Addended by: Laurey Morale on: 05/22/2023 01:04 PM   Modules accepted: Level of Service

## 2023-05-22 NOTE — Patient Instructions (Addendum)
Medication Changes:  Start Lasix 20 mg (1 tablet) daily.   Start Jardiance 10 mg (1 tablet) daily  Start Potassium 10 mEq (1 tablet) daily  Start Lisinopril 5 mg (1 tablet) daily  Start Crestor 10 mg (1 tablet) daily  Lab Work:  Labs done today, your results will be available in MyChart, we will contact you for abnormal readings.  Please come in a week to have your labs  completed.      Referrals:  You will receive a call regarding medication for semaglutide or mounjaro.   Special Instructions // Education:  Do the following things EVERYDAY: Weigh yourself in the morning before breakfast. Write it down and keep it in a log. Take your medicines as prescribed Eat low salt foods--Limit salt (sodium) to 2000 mg per day.  Stay as active as you can everyday Limit all fluids for the day to less than 2 liters   Follow-Up in: please follow up in 3 weeks with Dr. Shirlee Latch.     If you have any questions or concerns before your next appointment please send [place] a message through [place] or call our office at [phone] Monday-Friday 8 am-5 pm.   If you have an urgent need after hours on the weekend please call your Primary Cardiologist or the Advanced Heart Failure Clinic in [place] at [phone].

## 2023-05-23 ENCOUNTER — Telehealth: Payer: Self-pay | Admitting: Pharmacist

## 2023-05-23 ENCOUNTER — Telehealth: Payer: Self-pay

## 2023-05-23 ENCOUNTER — Other Ambulatory Visit (HOSPITAL_COMMUNITY): Payer: Self-pay

## 2023-05-23 DIAGNOSIS — M47896 Other spondylosis, lumbar region: Secondary | ICD-10-CM | POA: Diagnosis not present

## 2023-05-23 DIAGNOSIS — M545 Low back pain, unspecified: Secondary | ICD-10-CM | POA: Diagnosis not present

## 2023-05-23 DIAGNOSIS — M791 Myalgia, unspecified site: Secondary | ICD-10-CM | POA: Diagnosis not present

## 2023-05-23 DIAGNOSIS — M5136 Other intervertebral disc degeneration, lumbar region: Secondary | ICD-10-CM | POA: Diagnosis not present

## 2023-05-23 NOTE — Telephone Encounter (Signed)
He just changed his med regimen yesterday.  Would wait a few days for results.

## 2023-05-23 NOTE — Telephone Encounter (Signed)
-----   Message from Nurse Sheryn Bison sent at 05/22/2023 12:42 PM EDT ----- Regarding: semaglutide or ozempic Hi per Dr. Shirlee Latch  Pt needs semaglutide or mounjaro   Referred to weight loss completed   Thank you

## 2023-05-23 NOTE — Telephone Encounter (Addendum)
A1c 6.5% on 03/05/23. Started on Jardiance yesterday for diastolic CHF.  BMI 34.  Has [employer] Medicare [employer] D plan. Mounjaro available on formulary without prior authorization. Anticipated copay $45/month.  Called pt to discuss, no answer, voicemail is full. Will try back later.

## 2023-05-26 NOTE — Telephone Encounter (Signed)
2nd call made to pt, call went to voicemail which is still full.

## 2023-05-26 NOTE — Telephone Encounter (Signed)
Pt aware Reports he is feeling a little better

## 2023-05-28 DIAGNOSIS — F3111 Bipolar disorder, current episode manic without psychotic features, mild: Secondary | ICD-10-CM | POA: Diagnosis not present

## 2023-05-30 NOTE — Telephone Encounter (Signed)
3rd call made to pt, no answer but was able to leave a message this time.

## 2023-06-06 ENCOUNTER — Encounter: Payer: Self-pay | Admitting: Pharmacist

## 2023-06-06 ENCOUNTER — Telehealth: Payer: Self-pay | Admitting: Cardiology

## 2023-06-06 NOTE — Telephone Encounter (Signed)
4th call made to patient. No answer, left another message. This was final attempt to reach patient. Will send him a MyChart message as well.

## 2023-06-06 NOTE — Telephone Encounter (Signed)
Pt returning call from Pharmacist. I made pt aware that message was sent via MyChart. He states that he will respond via MyChart as well. Please advise

## 2023-06-06 NOTE — Telephone Encounter (Signed)
Have followed up with pt via mychart.

## 2023-06-10 DIAGNOSIS — M1711 Unilateral primary osteoarthritis, right knee: Secondary | ICD-10-CM | POA: Diagnosis not present

## 2023-06-17 DIAGNOSIS — M1711 Unilateral primary osteoarthritis, right knee: Secondary | ICD-10-CM | POA: Diagnosis not present

## 2023-06-18 DIAGNOSIS — Z1330 Encounter for screening examination for mental health and behavioral disorders, unspecified: Secondary | ICD-10-CM | POA: Diagnosis not present

## 2023-06-19 DIAGNOSIS — R972 Elevated prostate specific antigen [PSA]: Secondary | ICD-10-CM | POA: Diagnosis not present

## 2023-06-19 DIAGNOSIS — Z9889 Other specified postprocedural states: Secondary | ICD-10-CM | POA: Diagnosis not present

## 2023-06-19 DIAGNOSIS — Z6831 Body mass index (BMI) 31.0-31.9, adult: Secondary | ICD-10-CM | POA: Diagnosis not present

## 2023-06-19 DIAGNOSIS — R600 Localized edema: Secondary | ICD-10-CM | POA: Diagnosis not present

## 2023-06-19 DIAGNOSIS — F3111 Bipolar disorder, current episode manic without psychotic features, mild: Secondary | ICD-10-CM | POA: Diagnosis not present

## 2023-06-19 DIAGNOSIS — L03115 Cellulitis of right lower limb: Secondary | ICD-10-CM | POA: Diagnosis not present

## 2023-06-19 DIAGNOSIS — I10 Essential (primary) hypertension: Secondary | ICD-10-CM | POA: Diagnosis not present

## 2023-06-19 DIAGNOSIS — R7309 Other abnormal glucose: Secondary | ICD-10-CM | POA: Diagnosis not present

## 2023-06-19 DIAGNOSIS — G4733 Obstructive sleep apnea (adult) (pediatric): Secondary | ICD-10-CM | POA: Diagnosis not present

## 2023-06-19 DIAGNOSIS — Z8679 Personal history of other diseases of the circulatory system: Secondary | ICD-10-CM | POA: Diagnosis not present

## 2023-06-24 DIAGNOSIS — M1711 Unilateral primary osteoarthritis, right knee: Secondary | ICD-10-CM | POA: Diagnosis not present

## 2023-06-24 DIAGNOSIS — M170 Bilateral primary osteoarthritis of knee: Secondary | ICD-10-CM | POA: Diagnosis not present

## 2023-06-24 DIAGNOSIS — M545 Low back pain, unspecified: Secondary | ICD-10-CM | POA: Diagnosis not present

## 2023-06-25 DIAGNOSIS — F3111 Bipolar disorder, current episode manic without psychotic features, mild: Secondary | ICD-10-CM | POA: Diagnosis not present

## 2023-06-27 DIAGNOSIS — R3915 Urgency of urination: Secondary | ICD-10-CM | POA: Diagnosis not present

## 2023-06-27 DIAGNOSIS — R351 Nocturia: Secondary | ICD-10-CM | POA: Diagnosis not present

## 2023-06-27 DIAGNOSIS — R350 Frequency of micturition: Secondary | ICD-10-CM | POA: Diagnosis not present

## 2023-06-27 DIAGNOSIS — R3913 Splitting of urinary stream: Secondary | ICD-10-CM | POA: Diagnosis not present

## 2023-06-27 DIAGNOSIS — N401 Enlarged prostate with lower urinary tract symptoms: Secondary | ICD-10-CM | POA: Diagnosis not present

## 2023-07-01 ENCOUNTER — Telehealth: Payer: Self-pay

## 2023-07-01 ENCOUNTER — Telehealth: Payer: Self-pay | Admitting: *Deleted

## 2023-07-01 DIAGNOSIS — I480 Paroxysmal atrial fibrillation: Secondary | ICD-10-CM

## 2023-07-01 DIAGNOSIS — I2510 Atherosclerotic heart disease of native coronary artery without angina pectoris: Secondary | ICD-10-CM

## 2023-07-01 NOTE — Telephone Encounter (Signed)
Echo order placed.

## 2023-07-02 ENCOUNTER — Telehealth: Payer: Self-pay | Admitting: Pharmacist

## 2023-07-02 ENCOUNTER — Ambulatory Visit (HOSPITAL_BASED_OUTPATIENT_CLINIC_OR_DEPARTMENT_OTHER): Payer: Medicare HMO | Admitting: Cardiology

## 2023-07-02 ENCOUNTER — Ambulatory Visit
Admission: RE | Admit: 2023-07-02 | Discharge: 2023-07-02 | Disposition: A | Discharge status: Home / Self Care | Payer: Medicare HMO | Source: Ambulatory Visit | Attending: Cardiology | Admitting: Cardiology

## 2023-07-02 VITALS — BP 112/81 | HR 91 | Wt 255.0 lb

## 2023-07-02 DIAGNOSIS — E669 Obesity, unspecified: Secondary | ICD-10-CM | POA: Diagnosis not present

## 2023-07-02 DIAGNOSIS — G4733 Obstructive sleep apnea (adult) (pediatric): Secondary | ICD-10-CM | POA: Diagnosis not present

## 2023-07-02 DIAGNOSIS — I2510 Atherosclerotic heart disease of native coronary artery without angina pectoris: Secondary | ICD-10-CM | POA: Insufficient documentation

## 2023-07-02 DIAGNOSIS — I5032 Chronic diastolic (congestive) heart failure: Secondary | ICD-10-CM | POA: Diagnosis not present

## 2023-07-02 DIAGNOSIS — R9431 Abnormal electrocardiogram [ECG] [EKG]: Secondary | ICD-10-CM | POA: Diagnosis not present

## 2023-07-02 DIAGNOSIS — I480 Paroxysmal atrial fibrillation: Secondary | ICD-10-CM | POA: Diagnosis not present

## 2023-07-02 DIAGNOSIS — I110 Hypertensive heart disease with heart failure: Secondary | ICD-10-CM | POA: Insufficient documentation

## 2023-07-02 DIAGNOSIS — Z87891 Personal history of nicotine dependence: Secondary | ICD-10-CM | POA: Insufficient documentation

## 2023-07-02 DIAGNOSIS — I4819 Other persistent atrial fibrillation: Secondary | ICD-10-CM | POA: Diagnosis not present

## 2023-07-02 LAB — ECHOCARDIOGRAM COMPLETE
AR max vel: 2.63 cm2
AV Area VTI: 2.89 cm2
AV Area mean vel: 2.67 cm2
AV Mean grad: 2 mmHg
AV Peak grad: 4.2 mmHg
Ao pk vel: 1.03 m/s
Area-P 1/2: 4.83 cm2
Est EF: 55
MV VTI: 2.78 cm2
S' Lateral: 3.4 cm

## 2023-07-02 MED ORDER — MOUNJARO 2.5 MG/0.5ML ~~LOC~~ SOAJ: 2.5000 mg | SUBCUTANEOUS | 0 refills | Status: DC

## 2023-07-02 MED ORDER — METOPROLOL SUCCINATE ER 25 MG PO TB24: 25.0000 mg | ORAL_TABLET | Freq: Every day | ORAL | 6 refills | Status: DC

## 2023-07-02 NOTE — Progress Notes (Signed)
ADVANCED HEART FAILURE CLINIC NOTE  Referring Physician: Barbette Reichmann, MD  Primary Care: Barbette Reichmann, MD  HPI: Shane Lamb is a 72 y.o. male with hypertension, persistent atrial fibrillation, chronic prostatitis, and osteoarthritis. [name] reports that the only cardiac history he is aware of is a diagnosis of atrial fibrillation previously followed at [facility]. Based on chart review, [name] has been diagnosed with atrial fibrillation/atrial flutter dating back to at least 2000. At that time he underwent AFL ablation at [facility]. In addition, he has undergone PVI/CSI AF ablation in 2012 and a surgical epicardial ablation for atrial fibrillation in 2017. He was previously on rhythm control with Tikosyn that he self-discontinued and since that time has been on rate control with diltiazem.  He appears to have been in atrial fibrillation since 2017.   Echo in 1/24 showed EF 55-60%, normal RV, mild-moderate MR.   He saw Dr. Lalla Brothers in 2/24 for EP evaluation.  Dr. Lalla Brothers did not think that there would be utility to repeat ablation due to low risk of maintaining NSR.  He also was interested in trying Tikosyn again, but Dr. Lalla Brothers recommended against this.   Calcium score scan 3/24 with CAC 261 Agatston units, 55th percentile.   Echo was done today and reviewed, EF 55%, normal RV, biatrial enlargement, IVC normal.   Patient returns for followup of atrial fibrillation. He remains in coarse atrial fibrillation today, does not feel palpitations. He has chronic lower extremity edema that developed after starting diltiazem.  Weight down 2 lbs.  No chest pain.  No significant exertional dyspnea.  Main limitation is right knee pain.    ECG (personally reviewed): Coarse atrial fibrillation rate 99  Labs (1/24): LDL 105, K 5, creatinine 0.87 Labs (4/24): K 4.7, creatinine 107 Labs (8/24): K 4.6, creatinine 0.9, LDL 66, hgb 16.2   Past Medical History:  Diagnosis Date   Atrial  fibrillation (HCC)    Hypertension    Prostatitis, chronic     Current Outpatient Medications  Medication Sig Dispense Refill   acetaminophen (TYLENOL) 325 MG tablet Take 650 mg by mouth every 6 (six) hours as needed.     ALPRAZolam (XANAX) 1 MG tablet Take 1 mg by mouth at bedtime as needed for sleep.     apixaban (ELIQUIS) 5 MG TABS tablet Take 5 mg by mouth 2 (two) times daily.     B Complex-C (B-COMPLEX WITH VITAMIN C) tablet Take 1 tablet by mouth daily.     carboxymethylcellulose (REFRESH PLUS) 0.5 % SOLN Place 1 drop into both eyes 3 (three) times daily as needed.     Cholecalciferol 50 MCG (2000 UT) CAPS Take by mouth.     cyclobenzaprine (FLEXERIL) 10 MG tablet Take 1 tablet by mouth 3 (three) times daily as needed.     diltiazem (CARDIZEM CD) 360 MG 24 hr capsule Take 360 mg by mouth daily.     empagliflozin (JARDIANCE) 10 MG TABS tablet Take 1 tablet (10 mg total) by mouth daily before breakfast. 30 tablet 6   furosemide (LASIX) 20 MG tablet Take 1 tablet (20 mg total) by mouth daily. 90 tablet 3   HYDROcodone-acetaminophen (NORCO) 10-325 MG tablet Take 1 tablet by mouth 2 (two) times daily as needed for severe pain.     lisinopril (ZESTRIL) 5 MG tablet Take 1 tablet (5 mg total) by mouth daily. 90 tablet 3   lithium 300 MG tablet Take 300 mg by mouth 3 (three) times daily.  Magnesium 200 MG TABS Take 2 tablets by mouth daily.     metoprolol succinate (TOPROL XL) 25 MG 24 hr tablet Take 1 tablet (25 mg total) by mouth daily. 30 tablet 6   Omega-3 1000 MG CAPS Take by mouth.     potassium chloride (KLOR-CON) 10 MEQ tablet Take 1 tablet (10 mEq total) by mouth daily. 90 tablet 3   rosuvastatin (CRESTOR) 10 MG tablet Take 1 tablet (10 mg total) by mouth daily. 90 tablet 3   sildenafil (VIAGRA) 100 MG tablet Take 100 mg by mouth daily as needed for erectile dysfunction.     tadalafil (CIALIS) 10 MG tablet Take by mouth.     tamsulosin (FLOMAX) 0.4 MG CAPS capsule Take 0.4 mg by  mouth daily.     FLUoxetine (PROZAC) 40 MG capsule Take 40 mg by mouth daily. (Patient not taking: Reported on 05/22/2023)     ofloxacin (FLOXIN) 0.3 % OTIC solution Place 5 drops into both ears daily. (Patient not taking: Reported on 05/22/2023)     tirzepatide [facility]) 2.5 MG/0.5ML Pen Inject 2.5 mg into the skin once a week. 2 mL 0   zolpidem (AMBIEN) 5 MG tablet Take 5 mg by mouth at bedtime as needed for sleep. (Patient not taking: Reported on 05/22/2023)     No current facility-administered medications for this visit.    Allergies  Allergen Reactions   Amiodarone Nausea And Vomiting      Social History   Socioeconomic History   Marital status: Married    Spouse name: Not on file   Number of children: Not on file   Years of education: Not on file   Highest education level: Not on file  Occupational History   Occupation: retired  Tobacco Use   Smoking status: Former    Current packs/day: 0.00    Types: Cigarettes    Quit date: 1975    Years since quitting: 49.6   Smokeless tobacco: Former    Quit date: 1974  Substance and Sexual Activity   Alcohol use: Yes   Drug use: No   Sexual activity: Yes  Other Topics Concern   Not on file  Social History Narrative   Not on file   Social Determinants of Health   Financial Resource Strain: Low Risk  (04/23/2023)   Received from [facility], [facility]   Overall Financial Resource Strain (CARDIA)    Difficulty of Paying Living Expenses: Not hard at all  Food Insecurity: No Food Insecurity (04/23/2023)   Received from [facility], [facility]   Hunger Vital Sign    Worried About Running Out of Food in the Last Year: Never true    Ran Out of Food in the Last Year: Never true  Transportation Needs: No Transportation Needs (04/23/2023)   Received from [facility], [facility]   PRAPARE - Transportation    In the past 12  months, has lack of transportation kept you from medical appointments or from getting medications?: No    Lack of Transportation (Non-Medical): No  Physical Activity: Not on file  Stress: Not on file  Social Connections: Not on file  Intimate Partner Violence: Not on file      Family History  Problem Relation Age of Onset   Hypertension Father    Depression Father     PHYSICAL EXAM: Vitals:   07/02/23 1050  BP: 112/81  Pulse: 91  SpO2: 95%  General: NAD Neck: No JVD, no thyromegaly or thyroid nodule.  Lungs: Clear to auscultation bilaterally with normal respiratory effort. CV: Nondisplaced PMI.  Heart irregular S1/S2, no S3/S4, no murmur.  1+ ankle edema.  No carotid bruit.  Normal pedal pulses.  Abdomen: Soft, nontender, no hepatosplenomegaly, no distention.  Skin: Intact without lesions or rashes.  Neurologic: Alert and oriented x 3.  Psych: Normal affect. Extremities: No clubbing or cyanosis.  HEENT: Normal.   DATA REVIEW  ECHO: 06/25/21 ([facility]): LVEF >55%, mild LVH 06/05/20 ([facility]): LVEF >55%, normal RV function. 1/24:  Echo in 1/24 showed EF 55-60%, normal RV, mild-moderate MR. 8/24: Echo showed EF 55%, normal RV, biatrial enlargement, IVC normal.  Myocardial perfusion / Lexiscan ([facility]): (06/05/2020) SPECT images demonstrate homogeneous tracer distribution throughout the myocardium. Defect type:  Normal   ABIs normal in 2/22   ASSESSMENT & PLAN:  1. Atrial fibrillation: As per chart review, hx of atrial flutter ablation in 10/1999 & atrial fibrillation ablation (PVI & CSI) in 2012.  Surgical epicardial ablation in 06/2016.  Previously on Tikosyn but stopped in the past. Persistent AF since at least 2019 based on ECGs in our system.  Coarse AF again today on ECG. Saw Dr. Lalla Brothers with EP, did not think that repeat ablation would be successful. Patient also asked about trying Tikosyn again.  I spoke with Dr. Lalla Brothers about this as well and he did not think this was likely  to be successful given the amount of time he has been in atrial fibrillation. However, his atria are not markedly dilated.  He failed flecainide and amiodarone due to adverse side effects (severe nausea with amiodarone).  He is still interested in getting back in NSR. HR in 90s.  - Continue apixaban 5mg  BID.  - Patient still interested in trying Tikosyn again and having another ablation. He wants to talk with EP again.  He does not want to go back to [facility] and would like referral to EP at [facility], which I will provide.   - Diltiazem CD may be contributing to peripheral edema.  I will start him on Toprol XL 25 mg daily and titrate up, will then try to start titrating down on diltiazem.  2. HTN: BP controlled on current regimen.   3. CAD:  Calcium score 261 Agatston units in 3/24.  No chest pain.  - Continue Crestor, good lipids in 8/24.  - No ASA given apixaban use.  4. Chronic diastolic CHF:  Echo today showed EF 55%, normal RV, biatrial enlargement, IVC normal.  Weight is down 2 lbs.  He has peripheral edema but no JVD, think venous insufficiency.  NYHA class II.  - Continue Lasix 20 mg daily, would not increase.  - Continue Jardiance 10 mg daily.  5. OSA: Continue CPAP.  6. Obesity: Patient is interested in GLP-1 inhibitor, will discuss initiation of Mounjaro with our pharmacist.   Followup 3 wks with our pharmacist to work on titrating up Toprol XL and down diltiazem.  Followup 3 months with MD.   Marca Ancona 07/02/2023

## 2023-07-02 NOTE — Addendum Note (Signed)
Encounter addended by: Carolyne Fiscal on: 07/02/2023 10:04 AM  Actions taken: Imaging Exam ended, Clinical Note Signed

## 2023-07-02 NOTE — Telephone Encounter (Signed)
Monjaro education completed during visit with Dr Shirlee Latch this morning.  Patient will get Rx at the pharmacy and come Wednesday 8/28 for administration teaching while in clinic.   Annaliz Aven Rodriguez-Guzman PharmD, BCPS 07/02/2023 11:40 AM

## 2023-07-02 NOTE — Patient Instructions (Addendum)
Medication Changes:  START Metoprolol XL 25 mg Daily  Lab Work:  None   Testing/Procedures:  none  Referrals:  You have been referred to Dr Herbert Deaner at [facility] for afib, they will call you for an appointment  Dr Sheran Luz: CONTACT INFORMATION Appointments: [phone] Fax: [phone] ADDRESS [facility] Cardiology at [facility]: [address] [place], [place] [address]  Special Instructions // Education:  Do the following things EVERYDAY: Weigh yourself in the morning before breakfast. Write it down and keep it in a log. Take your medicines as prescribed Eat low salt foods--Limit salt (sodium) to 2000 mg per day.  Stay as active as you can everyday Limit all fluids for the day to less than 2 liters  Follow-Up in:   Please follow up with our heart failure pharmacist in 3 weeks  Your physician recommends that you schedule a follow-up appointment in: 3 months    If you have any questions or concerns before your next appointment please send [place] a message through mychart or call our office at [phone] Monday-Friday 8 am-5 pm.   If you have an urgent need after hours on the weekend please call your Primary Cardiologist or the Advanced Heart Failure Clinic in [place] at [phone].

## 2023-07-02 NOTE — Progress Notes (Signed)
*  PRELIMINARY RESULTS* Echocardiogram 2D Echocardiogram has been performed.  Shane Lamb 07/02/2023, 10:04 AM

## 2023-07-02 NOTE — Addendum Note (Signed)
Encounter addended by: Carolyne Fiscal on: 07/02/2023 9:22 AM  Actions taken: Imaging Exam begun

## 2023-07-08 ENCOUNTER — Telehealth: Payer: Self-pay

## 2023-07-08 NOTE — Telephone Encounter (Signed)
Ref/Demographics/EKG/OV Note faxed to Dr. Essie Christine EP  for EP evaluation.  Faxed to : [phone]

## 2023-07-09 ENCOUNTER — Telehealth: Payer: Self-pay

## 2023-07-09 NOTE — Telephone Encounter (Signed)
Pt called stating that he will not be able to come on Wednesday to see the pharmacist. Pt stated feeling sick, having sore throat.  Pt will call back to reschedule.

## 2023-07-18 ENCOUNTER — Other Ambulatory Visit (HOSPITAL_COMMUNITY): Payer: Self-pay

## 2023-07-18 NOTE — Progress Notes (Unsigned)
Advanced Heart Failure Clinic Note  PCP: Barbette Reichmann, MD PCP-Cardiologist: None HF-Cardiologist: Dr. Marca Ancona  HPI:  Shane Lamb is a 72 y.o. male with hypertension, persistent atrial fibrillation, chronic prostatitis, and osteoarthritis. Shane Lamb reports that the only cardiac history he is aware of is a diagnosis of atrial fibrillation previously followed at [facility]. Based on chart review, Shane Lamb has been diagnosed with atrial fibrillation/atrial flutter dating back to at least 2000. At that time he underwent AFL ablation at [facility]. In addition, he has undergone PVI/CSI AF ablation in 2012 and a surgical epicardial ablation for atrial fibrillation in 2017. He was previously on rhythm control with Tikosyn that he self-discontinued and since that time has been on rate control with diltiazem.  He appears to have been in atrial fibrillation since 2017.    Echo in 1/24 showed EF 55-60%, normal RV, mild-moderate MR.    He saw Dr. Lalla Brothers in 2/24 for EP evaluation.  Dr. Lalla Brothers did not think that there would be utility to repeat ablation due to low risk of maintaining NSR.  He also was interested in trying Tikosyn again, but Dr. Lalla Brothers recommended against this.    Calcium score scan 3/24 with CAC 261 Agatston units, 55th percentile.    Echo was done today and reviewed, EF 55%, normal RV, biatrial enlargement, IVC normal.    Patient returned to AHF clinic on 07/02/23 for followup of atrial fibrillation. Had chronic lower extremity edema that developed after starting diltiazem.   Today Shane Lamb returns to Heart Failure Clinic for pharmacist medication titration. Reports feeling ***. {Reports/Denies:210917258}. {ACTIONS;DENIES/REPORTS:21021675::"Denies"} being able to complete all activities of daily living (ADLs). Is *** active throughout the day. Weight at home is *** pounds. Takes {CHL AMB AHFC Medications:210917260} ***. Appetite ***. {Does Follow/Does Not  Follow:210917261} a low sodium diet.  Current HF Medications:                Has the patient been experiencing any side effects to the medications prescribed? {yes/no:20286}  Does the patient have any problems obtaining medications due to transportation or finances? {yes/no:20286}  Understanding of regimen: {CHL AMB AHFC Excellent/Good/Fair/Poor:210917262}  Understanding of indications: {CHL AMB AHFC Excellent/Good/Fair/Poor:210917262}  Potential of adherence: {CHL AMB AHFC Excellent/Good/Fair/Poor:210917262}  Patient understands to avoid NSAIDs.  Patient understands to avoid decongestants.  Pertinent Lab Values: Creatinine, Ser  Date Value Ref Range Status  05/22/2023 1.02 0.61 - 1.24 mg/dL Final   BUN  Date Value Ref Range Status  05/22/2023 27 (H) 8 - 23 mg/dL Final   Potassium  Date Value Ref Range Status  05/22/2023 4.0 3.5 - 5.1 mmol/L Final   Sodium  Date Value Ref Range Status  05/22/2023 136 135 - 145 mmol/L Final   B Natriuretic Peptide  Date Value Ref Range Status  05/22/2023 52.4 0.0 - 100.0 pg/mL Final    Comment:    Performed at [facility], [address] [place], [place] [address]   Magnesium  Date Value Ref Range Status  04/11/2016 2.5 (H) 1.7 - 2.4 mg/dL Final    Vital Signs: There were no vitals filed for this visit.  Assessment/Plan: 1. Atrial fibrillation: As per chart review, hx of atrial flutter ablation in 10/1999 & atrial fibrillation ablation (PVI & CSI) in 2012.  Surgical epicardial ablation in 06/2016.  Previously on Tikosyn but stopped in the past. Persistent AF since at least 2019 based on ECGs in our system.  Coarse AF again today on ECG. Saw  Dr. Lalla Brothers with EP, did not think that repeat ablation would be successful. Patient also asked about trying Tikosyn again.  I spoke with Dr. Lalla Brothers about this as well and he did not think this was likely to be successful given the amount of time he has been in atrial  fibrillation. However, his atria are not markedly dilated.  He failed flecainide and amiodarone due to adverse side effects (severe nausea with amiodarone).  He is still interested in getting back in NSR. HR in 90s.  - Continue apixaban 5mg  BID.  - Patient still interested in trying Tikosyn again and having another ablation. He wants to talk with EP again.  He does not want to go back to [facility] and would like referral to EP at [facility], which I will provide.   - Diltiazem CD may be contributing to peripheral edema.  I will start him on Toprol XL 25 mg daily and titrate up, will then try to start titrating down on diltiazem.  2. HTN: BP controlled on current regimen.   3. CAD:  Calcium score 261 Agatston units in 3/24.  No chest pain.  - Continue Crestor, good lipids in 8/24.  - No ASA given apixaban use.  4. Chronic diastolic CHF:  Echo today showed EF 55%, normal RV, biatrial enlargement, IVC normal.  Weight is down 2 lbs.  He has peripheral edema but no JVD, think venous insufficiency.  NYHA class II.  - Continue Lasix 20 mg daily, would not increase.  - Continue Jardiance 10 mg daily.  5. OSA: Continue CPAP.  6. Obesity: Patient is interested in GLP-1 inhibitor, will discuss initiation of Mounjaro with our pharmacist.   Follow up: ***  ***

## 2023-07-23 ENCOUNTER — Other Ambulatory Visit: Payer: Medicare HMO | Admitting: Pharmacist

## 2023-07-30 ENCOUNTER — Other Ambulatory Visit: Payer: Medicare HMO

## 2023-07-31 DIAGNOSIS — F3111 Bipolar disorder, current episode manic without psychotic features, mild: Secondary | ICD-10-CM | POA: Diagnosis not present

## 2023-08-07 DIAGNOSIS — Z1321 Encounter for screening for nutritional disorder: Secondary | ICD-10-CM | POA: Diagnosis not present

## 2023-08-07 DIAGNOSIS — M1711 Unilateral primary osteoarthritis, right knee: Secondary | ICD-10-CM | POA: Diagnosis not present

## 2023-08-07 DIAGNOSIS — I10 Essential (primary) hypertension: Secondary | ICD-10-CM | POA: Diagnosis not present

## 2023-08-07 DIAGNOSIS — G4733 Obstructive sleep apnea (adult) (pediatric): Secondary | ICD-10-CM | POA: Diagnosis not present

## 2023-08-07 DIAGNOSIS — F988 Other specified behavioral and emotional disorders with onset usually occurring in childhood and adolescence: Secondary | ICD-10-CM | POA: Diagnosis not present

## 2023-08-07 DIAGNOSIS — F319 Bipolar disorder, unspecified: Secondary | ICD-10-CM | POA: Diagnosis not present

## 2023-08-07 DIAGNOSIS — I4891 Unspecified atrial fibrillation: Secondary | ICD-10-CM | POA: Diagnosis not present

## 2023-08-08 DIAGNOSIS — M545 Low back pain, unspecified: Secondary | ICD-10-CM | POA: Diagnosis not present

## 2023-08-13 DIAGNOSIS — F319 Bipolar disorder, unspecified: Secondary | ICD-10-CM | POA: Diagnosis not present

## 2023-08-13 DIAGNOSIS — Z1330 Encounter for screening examination for mental health and behavioral disorders, unspecified: Secondary | ICD-10-CM | POA: Diagnosis not present

## 2023-08-14 DIAGNOSIS — M791 Myalgia, unspecified site: Secondary | ICD-10-CM | POA: Diagnosis not present

## 2023-08-14 DIAGNOSIS — M47896 Other spondylosis, lumbar region: Secondary | ICD-10-CM | POA: Diagnosis not present

## 2023-08-14 DIAGNOSIS — G541 Lumbosacral plexus disorders: Secondary | ICD-10-CM | POA: Diagnosis not present

## 2023-08-14 DIAGNOSIS — M545 Low back pain, unspecified: Secondary | ICD-10-CM | POA: Diagnosis not present

## 2023-08-27 ENCOUNTER — Telehealth: Payer: Self-pay | Admitting: Cardiology

## 2023-08-27 DIAGNOSIS — L600 Ingrowing nail: Secondary | ICD-10-CM | POA: Diagnosis not present

## 2023-08-27 DIAGNOSIS — M79674 Pain in right toe(s): Secondary | ICD-10-CM | POA: Diagnosis not present

## 2023-08-27 DIAGNOSIS — M778 Other enthesopathies, not elsewhere classified: Secondary | ICD-10-CM | POA: Diagnosis not present

## 2023-08-27 DIAGNOSIS — B351 Tinea unguium: Secondary | ICD-10-CM | POA: Diagnosis not present

## 2023-08-27 DIAGNOSIS — M79675 Pain in left toe(s): Secondary | ICD-10-CM | POA: Diagnosis not present

## 2023-08-29 DIAGNOSIS — M170 Bilateral primary osteoarthritis of knee: Secondary | ICD-10-CM | POA: Diagnosis not present

## 2023-09-01 ENCOUNTER — Encounter: Payer: Self-pay | Admitting: Urology

## 2023-09-01 ENCOUNTER — Ambulatory Visit: Payer: Medicare HMO | Admitting: Urology

## 2023-09-01 NOTE — Progress Notes (Unsigned)
   Advanced Heart Failure Clinic Note  PCP: Barbette Reichmann, MD PCP-Cardiologist: None HF-Cardiologist: {AHFC Cardiologist:210917259}  HPI:  ***  Today Shane Lamb returns to Heart Failure Clinic for pharmacist medication titration. Reports feeling ***. {Reports/Denies:210917258} {ACTIONS;DENIES/REPORTS:21021675::"Denies"} being able to complete all activities of daily living (ADLs). Is *** active throughout the day. Weight at home is *** pounds. Takes {CHL AMB AHFC Medications:210917260} ***. Appetite ***. {Does Follow/Does Not Follow:210917261} a low sodium diet.  Current Heart Failure Medications: Loop diuretic: Beta-Blocker: ACEI/ARB/ARNI: MRA: SGLT2i: Other:  Has the patient been experiencing any side effects to the medications prescribed? {yes/no:20286}  Does the patient have any problems obtaining medications due to transportation or finances? {yes/no:20286}  Understanding of regimen: {CHL AMB AHFC Excellent/Good/Fair/Poor:210917262}  Understanding of indications: {CHL AMB AHFC Excellent/Good/Fair/Poor:210917262}  Potential of adherence: {CHL AMB AHFC Excellent/Good/Fair/Poor:210917262}  Patient understands to avoid NSAIDs.  Patient understands to avoid decongestants.  Pertinent Lab Values: Creatinine, Ser  Date Value Ref Range Status  05/22/2023 1.02 0.61 - 1.24 mg/dL Final   BUN  Date Value Ref Range Status  05/22/2023 27 (H) 8 - 23 mg/dL Final   Potassium  Date Value Ref Range Status  05/22/2023 4.0 3.5 - 5.1 mmol/L Final   Sodium  Date Value Ref Range Status  05/22/2023 136 135 - 145 mmol/L Final   B Natriuretic Peptide  Date Value Ref Range Status  05/22/2023 52.4 0.0 - 100.0 pg/mL Final    Comment:    Performed at [facility], [address] [place], [place] [address]   Magnesium  Date Value Ref Range Status  04/11/2016 2.5 (H) 1.7 - 2.4 mg/dL Final    Vital Signs: There were no vitals filed for this  visit.  Assessment/Plan:  Follow up: ***  ***

## 2023-09-02 ENCOUNTER — Ambulatory Visit: Payer: Medicare HMO | Attending: Cardiology | Admitting: Pharmacist

## 2023-09-02 ENCOUNTER — Other Ambulatory Visit: Payer: Self-pay

## 2023-09-02 ENCOUNTER — Other Ambulatory Visit (HOSPITAL_COMMUNITY): Payer: Self-pay

## 2023-09-02 VITALS — BP 106/72 | HR 77 | Wt 237.0 lb

## 2023-09-02 DIAGNOSIS — E669 Obesity, unspecified: Secondary | ICD-10-CM | POA: Insufficient documentation

## 2023-09-02 DIAGNOSIS — I4819 Other persistent atrial fibrillation: Secondary | ICD-10-CM | POA: Diagnosis not present

## 2023-09-02 DIAGNOSIS — M1990 Unspecified osteoarthritis, unspecified site: Secondary | ICD-10-CM | POA: Diagnosis not present

## 2023-09-02 DIAGNOSIS — I110 Hypertensive heart disease with heart failure: Secondary | ICD-10-CM | POA: Insufficient documentation

## 2023-09-02 DIAGNOSIS — I2510 Atherosclerotic heart disease of native coronary artery without angina pectoris: Secondary | ICD-10-CM | POA: Diagnosis not present

## 2023-09-02 DIAGNOSIS — R9431 Abnormal electrocardiogram [ECG] [EKG]: Secondary | ICD-10-CM | POA: Diagnosis not present

## 2023-09-02 DIAGNOSIS — I480 Paroxysmal atrial fibrillation: Secondary | ICD-10-CM

## 2023-09-02 DIAGNOSIS — N411 Chronic prostatitis: Secondary | ICD-10-CM | POA: Insufficient documentation

## 2023-09-02 DIAGNOSIS — I5032 Chronic diastolic (congestive) heart failure: Secondary | ICD-10-CM | POA: Insufficient documentation

## 2023-09-02 DIAGNOSIS — G4733 Obstructive sleep apnea (adult) (pediatric): Secondary | ICD-10-CM | POA: Diagnosis not present

## 2023-09-02 MED ORDER — ROSUVASTATIN CALCIUM 10 MG PO TABS: 10.0000 mg | ORAL_TABLET | Freq: Every day | ORAL | 3 refills | Status: DC

## 2023-09-02 MED ORDER — METOPROLOL SUCCINATE ER 25 MG PO TB24: 25.0000 mg | ORAL_TABLET | Freq: Every day | ORAL | 6 refills | Status: DC

## 2023-09-02 MED ORDER — MOUNJARO 5 MG/0.5ML ~~LOC~~ SOAJ: 5.0000 mg | SUBCUTANEOUS | 0 refills | Status: DC

## 2023-09-02 MED ORDER — MOUNJARO 5 MG/0.5ML ~~LOC~~ SOAJ
5.0000 mg | SUBCUTANEOUS | 0 refills | Status: DC
  Filled 2023-09-02 (×2): qty 2, 28d supply, fill #0

## 2023-09-02 NOTE — Patient Instructions (Signed)
It was a pleasure seeing you today!  MEDICATIONS: -We are changing your medications today -Increase Mounjaro to 5 mg once weekly. Will call in 3 weeks to check in and send new dose. -Call if you have questions about your medications.  LABS: -We will call you if your labs need attention.  NEXT APPOINTMENT: Return to clinic in 1 month with Dr. Shirlee Latch.  In general, to take care of your heart failure: -Limit your fluid intake to 2 Liters (half-gallon) per day.   -Limit your salt intake to ideally 2-3 grams (2000-3000 mg) per day. -Weigh yourself daily and record, and bring that "weight diary" to your next appointment.  (Weight gain of 2-3 pounds in 1 day typically means fluid weight.) -The medications for your heart are to help your heart and help you live longer.   -Please contact us before stopping any of your heart medications.  Call the clinic at [phone] with questions or to reschedule future appointments.

## 2023-09-03 DIAGNOSIS — R682 Dry mouth, unspecified: Secondary | ICD-10-CM | POA: Diagnosis not present

## 2023-09-03 DIAGNOSIS — G4700 Insomnia, unspecified: Secondary | ICD-10-CM | POA: Diagnosis not present

## 2023-09-17 DIAGNOSIS — R3913 Splitting of urinary stream: Secondary | ICD-10-CM | POA: Diagnosis not present

## 2023-09-17 DIAGNOSIS — R350 Frequency of micturition: Secondary | ICD-10-CM | POA: Diagnosis not present

## 2023-09-22 ENCOUNTER — Telehealth: Payer: Self-pay | Admitting: Cardiology

## 2023-09-22 DIAGNOSIS — R350 Frequency of micturition: Secondary | ICD-10-CM | POA: Diagnosis not present

## 2023-09-22 DIAGNOSIS — I484 Atypical atrial flutter: Secondary | ICD-10-CM | POA: Diagnosis not present

## 2023-09-22 DIAGNOSIS — I4891 Unspecified atrial fibrillation: Secondary | ICD-10-CM | POA: Diagnosis not present

## 2023-09-22 MED ORDER — EMPAGLIFLOZIN 10 MG PO TABS: 10.0000 mg | ORAL_TABLET | Freq: Every day | ORAL | 5 refills | Status: DC

## 2023-09-22 NOTE — Telephone Encounter (Signed)
Jardiance rx sent to [facility].

## 2023-09-24 DIAGNOSIS — Z1331 Encounter for screening for depression: Secondary | ICD-10-CM | POA: Diagnosis not present

## 2023-09-24 DIAGNOSIS — F3111 Bipolar disorder, current episode manic without psychotic features, mild: Secondary | ICD-10-CM | POA: Diagnosis not present

## 2023-09-29 ENCOUNTER — Other Ambulatory Visit (HOSPITAL_COMMUNITY): Payer: Self-pay

## 2023-09-29 ENCOUNTER — Telehealth: Payer: Self-pay

## 2023-09-29 NOTE — Telephone Encounter (Signed)
Advanced Heart Failure Patient Advocate Encounter  The patient was approved for a [employer] grant that will help cover the cost of Diltiazem, Jardiance, Metoprolol.  Total amount awarded, $10,000.  Effective: 08/30/2023 - 08/28/2024.  BIN [id number] PCN [id number] Group [id number] ID [id number]  Approval and processing information added to [facility]. Patient informed in office.  Burnell Blanks, CPhT Rx Patient Advocate Phone: [phone]

## 2023-10-08 ENCOUNTER — Ambulatory Visit: Payer: Medicare HMO | Attending: Cardiology | Admitting: Cardiology

## 2023-10-08 ENCOUNTER — Encounter: Payer: Self-pay | Admitting: Cardiology

## 2023-10-08 VITALS — BP 113/75 | HR 83 | Resp 18 | Wt 232.5 lb

## 2023-10-08 DIAGNOSIS — I480 Paroxysmal atrial fibrillation: Secondary | ICD-10-CM | POA: Diagnosis not present

## 2023-10-08 MED ORDER — DILTIAZEM HCL ER COATED BEADS 240 MG PO CP24: 240.0000 mg | ORAL_CAPSULE | Freq: Every day | ORAL | 3 refills | Status: DC

## 2023-10-08 MED ORDER — METOPROLOL SUCCINATE ER 50 MG PO TB24: 50.0000 mg | ORAL_TABLET | Freq: Every day | ORAL | 3 refills | Status: DC

## 2023-10-08 NOTE — Patient Instructions (Signed)
DECREASE Diltiazem to 240mg  daily  INCREASE Toprol XL to 50mg  daily  Follow up in 3 months  Do the following things EVERYDAY: Weigh yourself in the morning before breakfast. Write it down and keep it in a log. Take your medicines as prescribed Eat low salt foods--Limit salt (sodium) to 2000 mg per day.  Stay as active as you can everyday Limit all fluids for the day to less than 2 liters

## 2023-10-11 NOTE — Progress Notes (Signed)
ADVANCED HEART FAILURE CLINIC NOTE  Referring Physician: Barbette Reichmann, MD  Primary Care: Barbette Reichmann, MD  HPI: Shane Lamb is a 72 y.o. male with hypertension, persistent atrial fibrillation, chronic prostatitis, and osteoarthritis. Shane Lamb reports that the only cardiac history he is aware of is a diagnosis of atrial fibrillation previously followed at [facility]. Based on chart review, Shane Lamb has been diagnosed with atrial fibrillation/atrial flutter dating back to at least 2000. At that time he underwent AFL ablation at [facility]. In addition, he has undergone PVI/CSI AF ablation in 2012 and a surgical epicardial ablation for atrial fibrillation in 2017. He was previously on rhythm control with Tikosyn that he self-discontinued and since that time has been on rate control with diltiazem.  He appears to have been in atrial fibrillation since 2017.   Echo in 1/24 showed EF 55-60%, normal RV, mild-moderate MR.   He saw Dr. Lalla Brothers in 2/24 for EP evaluation.  Dr. Lalla Brothers did not think that there would be utility to repeat ablation due to low risk of maintaining NSR.  He also was interested in trying Tikosyn again, but Dr. Lalla Brothers recommended against this.   Calcium score scan 3/24 with CAC 261 Agatston units, 55th percentile.   Echo in 8/24 showed EF 55%, normal RV, biatrial enlargement, IVC normal.   Patient returns for followup of atrial fibrillation. He remains in coarse atrial fibrillation today, does not feel palpitations. He has chronic lower extremity edema that developed after starting diltiazem. Weight is down 23 lbs, had been on tirzepatide but now cannot get coverage.  No exertional dyspnea or chest pain.  Primary complaint is right knee pain, using cane when walking. No orthopnea/PND.  No lightheadedness.      Labs (1/24): LDL 105, K 5, creatinine 0.87 Labs (4/24): K 4.7, creatinine 107 Labs (8/24): K 4.6, creatinine 0.9, LDL 66, hgb 16.2 Labs (10/24): K 4.8,  creatinine 1.1   Past Medical History:  Diagnosis Date   Atrial fibrillation (HCC)    Hypertension    Prostatitis, chronic     Current Outpatient Medications  Medication Sig Dispense Refill   acetaminophen (TYLENOL) 325 MG tablet Take 650 mg by mouth every 6 (six) hours as needed.     apixaban (ELIQUIS) 5 MG TABS tablet Take 5 mg by mouth 2 (two) times daily.     B Complex-C (B-COMPLEX WITH VITAMIN C) tablet Take 1 tablet by mouth daily.     Cholecalciferol 50 MCG (2000 UT) CAPS Take by mouth.     cyclobenzaprine (FLEXERIL) 10 MG tablet Take 1 tablet by mouth 3 (three) times daily as needed.     diltiazem (CARDIZEM CD) 240 MG 24 hr capsule Take 1 capsule (240 mg total) by mouth daily. 90 capsule 3   empagliflozin (JARDIANCE) 10 MG TABS tablet Take 1 tablet (10 mg total) by mouth daily before breakfast. 30 tablet 5   furosemide (LASIX) 20 MG tablet Take 1 tablet (20 mg total) by mouth daily. 90 tablet 3   lisinopril (ZESTRIL) 5 MG tablet Take 1 tablet (5 mg total) by mouth daily. 90 tablet 3   lithium 300 MG tablet Take 300 mg by mouth 3 (three) times daily.     Magnesium 200 MG TABS Take 2 tablets by mouth daily.     Omega-3 1000 MG CAPS Take by mouth.     potassium chloride (KLOR-CON) 10 MEQ tablet Take 1 tablet (10 mEq total) by mouth daily. 90 tablet 3   rosuvastatin (  CRESTOR) 10 MG tablet Take 1 tablet (10 mg total) by mouth daily. 90 tablet 3   sildenafil (VIAGRA) 100 MG tablet Take 100 mg by mouth daily as needed for erectile dysfunction.     tamsulosin (FLOMAX) 0.4 MG CAPS capsule Take 0.4 mg by mouth daily.     HYDROcodone-acetaminophen (NORCO) 10-325 MG tablet Take 1 tablet by mouth 2 (two) times daily as needed for severe pain.     metoprolol succinate (TOPROL XL) 50 MG 24 hr tablet Take 1 tablet (50 mg total) by mouth daily. 90 tablet 3   tirzepatide (MOUNJARO) 5 MG/0.5ML Pen Inject 5 mg into the skin once a week. (Patient not taking: Reported on 10/08/2023) 2 mL 0   No  current facility-administered medications for this visit.    Allergies  Allergen Reactions   Amiodarone Nausea And Vomiting      Social History   Socioeconomic History   Marital status: Married    Spouse name: Not on file   Number of children: Not on file   Years of education: Not on file   Highest education level: Not on file  Occupational History   Occupation: retired  Tobacco Use   Smoking status: Former    Current packs/day: 0.00    Types: Cigarettes    Quit date: 1975    Years since quitting: 49.9   Smokeless tobacco: Former    Quit date: 1974  Substance and Sexual Activity   Alcohol use: Yes   Drug use: No   Sexual activity: Yes  Other Topics Concern   Not on file  Social History Narrative   Not on file   Social Determinants of Health   Financial Resource Strain: Low Risk  (04/23/2023)   Received from [facility], [facility]   Overall Financial Resource Strain (CARDIA)    Difficulty of Paying Living Expenses: Not hard at all  Food Insecurity: No Food Insecurity (04/23/2023)   Received from [facility], [facility]   Hunger Vital Sign    Worried About Running Out of Food in the Last Year: Never true    Ran Out of Food in the Last Year: Never true  Transportation Needs: No Transportation Needs (04/23/2023)   Received from [facility], [facility]   PRAPARE - Transportation    In the past 12 months, has lack of transportation kept you from medical appointments or from getting medications?: No    Lack of Transportation (Non-Medical): No  Physical Activity: Not on file  Stress: Not on file  Social Connections: Not on file  Intimate Partner Violence: Not on file      Family History  Problem Relation Age of Onset   Hypertension Father    Depression Father     PHYSICAL EXAM: Vitals:   10/08/23 1007  BP: 113/75  Pulse: 83  Resp: 18  SpO2: 95%    General: NAD Neck: No JVD, no thyromegaly or thyroid nodule.  Lungs: Clear to auscultation bilaterally with normal respiratory effort. CV: Nondisplaced PMI.  Heart irregular S1/S2, no S3/S4, no murmur.  1+ ankle edema.  No carotid bruit.  Normal pedal pulses.  Abdomen: Soft, nontender, no hepatosplenomegaly, no distention.  Skin: Intact without lesions or rashes.  Neurologic: Alert and oriented x 3.  Psych: Normal affect. Extremities: No clubbing or cyanosis.  HEENT: Normal.   DATA REVIEW  ECHO: 06/25/21 ([facility]): LVEF >55%, mild LVH 06/05/20 ([facility]): LVEF >55%, normal  RV function. 1/24:  Echo in 1/24 showed EF 55-60%, normal RV, mild-moderate MR. 8/24: Echo showed EF 55%, normal RV, biatrial enlargement, IVC normal.  Myocardial perfusion / Lexiscan ([facility]): (06/05/2020) SPECT images demonstrate homogeneous tracer distribution throughout the myocardium. Defect type:  Normal   ABIs normal in 2/22   ASSESSMENT & PLAN:  1. Atrial fibrillation: As per chart review, hx of atrial flutter ablation in 10/1999 & atrial fibrillation ablation (PVI & CSI) in 2012.  Surgical epicardial ablation in 06/2016.  Previously on Tikosyn but stopped in the past. Persistent AF since at least 2019 based on ECGs in our system.  Coarse AF again today on ECG. Saw Dr. Lalla Brothers with EP, did not think that repeat ablation would be successful. Patient also asked about trying Tikosyn again.  I spoke with Dr. Lalla Brothers about this as well and he did not think this was likely to be successful given the amount of time he has been in atrial fibrillation. However, his atria are not markedly dilated by echo.  He failed flecainide and amiodarone due to adverse side effects (severe nausea with amiodarone).  He is still interested in getting back in NSR. HR in 80s today.   - Continue apixaban 5mg  BID.  - Patient still interested in trying Tikosyn again and having another ablation. He wants to talk with EP again.  He does not want to go  back to [facility] and would like referral to EP at [facility], which I will provide.   - Diltiazem CD may be contributing to peripheral edema. Increase Toprol XL to 50 mg daily and decrease diltiazem CD to 240 mg daily.  Can continue to titrate up on Toprol and down on diltiazem as tolerated.  2. HTN: BP controlled on current regimen.   3. CAD:  Calcium score 261 Agatston units in 3/24.  No chest pain.  - Continue Crestor, good lipids in 8/24.  - No ASA given apixaban use.  4. Chronic diastolic CHF:  Echo today showed EF 55%, normal RV, biatrial enlargement, IVC normal.  He has peripheral edema but no JVD, think venous insufficiency.  NYHA class II.  - Continue Lasix 20 mg daily, would not increase.  - Continue Jardiance 10 mg daily.  5. OSA: Continue CPAP.  6. Obesity: Patient did well on Mounjaro but insurance no longer covering.  He will be getting new insurance and we can try again to get it for him in the new year.    Followup 3 months.   Marca Ancona 10/11/2023

## 2023-10-21 ENCOUNTER — Telehealth: Payer: Self-pay | Admitting: Home Health

## 2023-10-21 NOTE — Telephone Encounter (Signed)
Agree, patient should go directly to ER for workup of CVA. Would not take ASA or Plavix with his Eliquis.

## 2023-10-21 NOTE — Telephone Encounter (Signed)
Patient called after hour line, states he had a mini stroke. He states he has been having these similar episodes x5 during the past year. He gets headache, dizzy, blurry vision, and felt like he is about to pass out. He is convinced he had a mini stroke. He states he checked his BP and HR, everything seems to be well. Advised the patient go to the ER for stroke evaluation immediately. He states he is not going to go to the ER since weather is bad and will go tomorrow. Informed the patient stroke evaluation can not be delayed until tomorrow. He asked if Dr Shirlee Latch can speed up the stroke workup and see him tomorrow. Informed the patient that he needs to go the ER and have ER provider evaluate him to determine what workup is indicated and we are not able to "speed up" anything tomorrow. He then asked if he should be taking a baby aspirin and plavix since he looked up on the internet and believe they may not be safe to take with Eliquis. Advised the patient not to randomly self medicate until proper evaluation is done. He insisted not going to ER tonight, despite he was told stroke evaluation should occur immediately if he has symptoms and concern.

## 2023-10-22 ENCOUNTER — Ambulatory Visit: Payer: Medicare HMO | Admitting: Urology

## 2023-10-22 ENCOUNTER — Encounter: Payer: Self-pay | Admitting: Urology

## 2023-10-22 VITALS — BP 128/78 | HR 90 | Ht 73.0 in | Wt 237.2 lb

## 2023-10-22 DIAGNOSIS — N390 Urinary tract infection, site not specified: Secondary | ICD-10-CM | POA: Diagnosis not present

## 2023-10-22 DIAGNOSIS — N401 Enlarged prostate with lower urinary tract symptoms: Secondary | ICD-10-CM | POA: Diagnosis not present

## 2023-10-22 LAB — URINALYSIS, COMPLETE
Bilirubin, UA: NEGATIVE
Leukocytes,UA: NEGATIVE
Nitrite, UA: NEGATIVE
Protein,UA: NEGATIVE
RBC, UA: NEGATIVE
Specific Gravity, UA: 1.015 (ref 1.005–1.030)
Urobilinogen, Ur: 0.2 mg/dL (ref 0.2–1.0)
pH, UA: 6 (ref 5.0–7.5)

## 2023-10-22 LAB — MICROSCOPIC EXAMINATION

## 2023-10-22 MED ORDER — GEMTESA 75 MG PO TABS: 75.0000 mg | ORAL_TABLET | Freq: Every day | ORAL | Status: DC

## 2023-10-22 NOTE — Progress Notes (Signed)
I, [name], acting as a scribe for Riki Altes, MD., have documented all relevant documentation on the behalf of Riki Altes, MD, as directed by Riki Altes, MD while in the presence of Riki Altes, MD.  10/22/2023 3:25 PM   Shane Lamb 12-22-1950 [id number]  Referring provider: Barbette Reichmann, MD [address] [facility] [place],  [place] [address]  Chief Complaint  Patient presents with   Urinary Incontinence    HPI: Shane Lamb is a 72 y.o. male presents to establish local urologic care.   Most recently seen by [facility] in [place] and requesting transfer as he lives in [place].  Saw Dr. Arita Miss recently for a lower urinary tract symptoms. He had a PVP performed in [place] approximately 7 years ago, which initially worked well, however he has recently noted worsening lower urinary tract symptoms including sensation of incomplete emptying, frequency, intermittent urinary stream,weak urinary stream, straining to urinate, and nocturia x5. His nocturia is associated with urinary hesitancy and weak urinary stream. [facility] records were reviewed. He was initially seen August 2024 with similar bothersome lower urinary tract symptoms. His IPSS at that time was 26/35. He underwent cystoscopy 09/17/2023 which showed an open bladder neck and occlusive mid and apical tissue L>R. TURP was discussed, however he elected not to pursue and wanted to look at other surgical options. PVR was 109 mL.   PMH: Past Medical History:  Diagnosis Date   Atrial fibrillation (HCC)    Hypertension    Prostatitis, chronic     Surgical History: Past Surgical History:  Procedure Laterality Date   CARDIAC ELECTROPHYSIOLOGY MAPPING AND ABLATION     JOINT REPLACEMENT     hip replacement   REPLACEMENT TOTAL KNEE      Home Medications:  Allergies as of 10/22/2023       Reactions   Amiodarone Nausea And Vomiting        Medication List         Accurate as of October 22, 2023  3:25 PM. If you have any questions, ask your nurse or doctor.          acetaminophen 325 MG tablet Commonly known as: TYLENOL Take 650 mg by mouth every 6 (six) hours as needed.   apixaban 5 MG Tabs tablet Commonly known as: ELIQUIS Take 5 mg by mouth 2 (two) times daily.   B-complex with vitamin C tablet Take 1 tablet by mouth daily.   Cholecalciferol 50 MCG (2000 UT) Caps Take by mouth.   cyclobenzaprine 10 MG tablet Commonly known as: FLEXERIL Take 1 tablet by mouth 3 (three) times daily as needed.   diltiazem 240 MG 24 hr capsule Commonly known as: CARDIZEM CD Take 1 capsule (240 mg total) by mouth daily.   empagliflozin 10 MG Tabs tablet Commonly known as: Jardiance Take 1 tablet (10 mg total) by mouth daily before breakfast.   furosemide 20 MG tablet Commonly known as: LASIX Take 1 tablet (20 mg total) by mouth daily.   Gemtesa 75 MG Tabs Generic drug: Vibegron Take 1 tablet (75 mg total) by mouth daily. Started by: Riki Altes   HYDROcodone-acetaminophen 10-325 MG tablet Commonly known as: NORCO Take 1 tablet by mouth 2 (two) times daily as needed for severe pain.   lisinopril 5 MG tablet Commonly known as: ZESTRIL Take 1 tablet (5 mg total) by mouth daily.   lithium 300 MG tablet Take 300 mg by mouth 3 (  three) times daily.   Magnesium 200 MG Tabs Take 2 tablets by mouth daily.   metoprolol succinate 50 MG 24 hr tablet Commonly known as: Toprol XL Take 1 tablet (50 mg total) by mouth daily.   Mounjaro 5 MG/0.5ML Pen Generic drug: tirzepatide Inject 5 mg into the skin once a week.   Omega-3 1000 MG Caps Take by mouth.   potassium chloride 10 MEQ tablet Commonly known as: KLOR-CON Take 1 tablet (10 mEq total) by mouth daily.   rosuvastatin 10 MG tablet Commonly known as: CRESTOR Take 1 tablet (10 mg total) by mouth daily.   sildenafil 100 MG tablet Commonly known as: VIAGRA Take 100 mg by  mouth daily as needed for erectile dysfunction.   tamsulosin 0.4 MG Caps capsule Commonly known as: FLOMAX Take 0.4 mg by mouth daily.        Allergies:  Allergies  Allergen Reactions   Amiodarone Nausea And Vomiting    Family History: Family History  Problem Relation Age of Onset   Hypertension Father    Depression Father     Social History:  reports that he quit smoking about 49 years ago. His smoking use included cigarettes. He quit smokeless tobacco use about 50 years ago. He reports current alcohol use. He reports that he does not use drugs.   Physical Exam: BP 128/78   Pulse 90   Ht 6\' 1"  (1.854 m)   Wt 237 lb 3.2 oz (107.6 kg)   BMI 31.29 kg/m   Constitutional:  Alert and oriented, No acute distress. HEENT: [place] AT Respiratory: Normal respiratory effort, no increased work of breathing. Psychiatric: Normal mood and affect.   Assessment & Plan:    1. BPH with LUTS Severe storage-related/obstructive voiding symptoms.  We discussed the outlet procedures. We discussed determination of prostate volume is beneficial in discussing outlet procedures; especially minimally invasive options.  Additional medical management for his storage-voidant symptoms with a beta-3 agonist was also discussed.  He states his insurance will change after the first of the year, and he wanted to hold off on any procedure until that time.  He was given samples of Gemtesa to try and will follow up in a 1 month. If he continues to desire surgical options, he will follow up 1 month for a transrectal ultrasound of the prostate for volume determination.   I have reviewed the above documentation for accuracy and completeness, and I agree with the above.   Riki Altes, MD  [facility] [address], [address] [place], [place] [address] [phone]

## 2023-10-22 NOTE — Telephone Encounter (Signed)
Attempted to follow up with pt regarding reporting to ER -no answer LMOM

## 2023-10-23 NOTE — Telephone Encounter (Signed)
Per chart pt contacted PCP and sch appt for today 12/12

## 2023-10-24 ENCOUNTER — Other Ambulatory Visit: Payer: Self-pay | Admitting: Physician Assistant

## 2023-10-24 ENCOUNTER — Telehealth: Payer: Self-pay | Admitting: Cardiology

## 2023-10-24 ENCOUNTER — Ambulatory Visit
Admission: RE | Admit: 2023-10-24 | Discharge: 2023-10-24 | Disposition: A | Discharge status: Home / Self Care | Payer: Medicare HMO | Source: Ambulatory Visit | Attending: Physician Assistant

## 2023-10-24 DIAGNOSIS — H539 Unspecified visual disturbance: Secondary | ICD-10-CM | POA: Diagnosis not present

## 2023-10-24 DIAGNOSIS — R42 Dizziness and giddiness: Secondary | ICD-10-CM | POA: Diagnosis not present

## 2023-10-24 DIAGNOSIS — F419 Anxiety disorder, unspecified: Secondary | ICD-10-CM | POA: Diagnosis not present

## 2023-10-24 NOTE — Telephone Encounter (Signed)
He is always in atrial fibrillation and had a relatively recent echo.  He needs workup for stroke as it appears his PCP has arranged.

## 2023-11-13 ENCOUNTER — Other Ambulatory Visit (HOSPITAL_COMMUNITY): Payer: Self-pay

## 2023-11-14 ENCOUNTER — Telehealth: Payer: Self-pay | Admitting: Urology

## 2023-11-14 NOTE — Telephone Encounter (Signed)
 Patient dropped in wanting to know if he can get more samples of Gemtesa, as they seemed to work or does a RX need to be sent over to his pharmacy. Please advise patient.

## 2023-11-17 ENCOUNTER — Encounter: Payer: Self-pay | Admitting: *Deleted

## 2023-11-17 ENCOUNTER — Other Ambulatory Visit: Payer: Self-pay | Admitting: *Deleted

## 2023-11-17 MED ORDER — GEMTESA 75 MG PO TABS: 75.0000 mg | ORAL_TABLET | Freq: Every day | ORAL | 11 refills | Status: DC

## 2023-11-17 NOTE — Telephone Encounter (Signed)
 Shane Lamb is sent to [facility]

## 2023-11-18 ENCOUNTER — Other Ambulatory Visit: Payer: Self-pay

## 2023-11-18 ENCOUNTER — Telehealth: Payer: Self-pay | Admitting: Pharmacist

## 2023-11-18 MED ORDER — EMPAGLIFLOZIN 10 MG PO TABS
10.0000 mg | ORAL_TABLET | Freq: Every day | ORAL | 5 refills | Status: DC
  Filled 2023-11-18: qty 30, 30d supply, fill #0
  Filled 2023-12-19: qty 30, 30d supply, fill #1
  Filled 2024-02-11 – 2024-04-01 (×2): qty 30, 30d supply, fill #2

## 2023-11-18 NOTE — Telephone Encounter (Signed)
 Patient called due to difficulty affording Jardiance. 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 Prescription sent to [facility] pharmacy.

## 2023-11-21 ENCOUNTER — Other Ambulatory Visit: Payer: Self-pay | Admitting: Urology

## 2023-12-03 ENCOUNTER — Encounter: Payer: Medicare HMO | Admitting: Urology

## 2023-12-15 ENCOUNTER — Other Ambulatory Visit (HOSPITAL_COMMUNITY): Payer: Self-pay

## 2023-12-15 ENCOUNTER — Telehealth: Payer: Self-pay

## 2023-12-15 NOTE — Telephone Encounter (Signed)
Patient Advocate Encounter  Prior authorization for Shane Lamb has been submitted and approved. Test billing returns $47 for 28 day supply.  KeyJoan Flores Effective: 12/13/2023 to 12/14/2024  Burnell Blanks, CPhT Rx Patient Advocate Phone: [phone]

## 2023-12-16 ENCOUNTER — Other Ambulatory Visit (HOSPITAL_COMMUNITY): Payer: Self-pay

## 2023-12-24 ENCOUNTER — Telehealth: Payer: Self-pay | Admitting: Cardiology

## 2023-12-24 NOTE — Telephone Encounter (Signed)
[facility] sched isn't out not able to sched pt on recall list

## 2023-12-31 ENCOUNTER — Other Ambulatory Visit: Payer: Self-pay

## 2024-02-06 ENCOUNTER — Telehealth: Payer: Self-pay | Admitting: Cardiology

## 2024-02-06 NOTE — Telephone Encounter (Incomplete)
 Called to confirm/remind patient of their appointment at the [facility] on 02/09/24***.   Appointment:   [x] Confirmed  [] Left mess   [] No answer/No voice mail  [] Phone not in service  Patient reminded to bring all medications and/or complete list.  Confirmed patient has transportation. Gave directions, instructed to utilize valet parking.

## 2024-02-09 ENCOUNTER — Other Ambulatory Visit
Admission: RE | Admit: 2024-02-09 | Discharge: 2024-02-09 | Disposition: A | Discharge status: Home / Self Care | Payer: Medicare (Managed Care) | Attending: Cardiology | Admitting: Cardiology

## 2024-02-09 ENCOUNTER — Ambulatory Visit (HOSPITAL_BASED_OUTPATIENT_CLINIC_OR_DEPARTMENT_OTHER): Payer: Medicare (Managed Care) | Admitting: Cardiology

## 2024-02-09 VITALS — BP 119/85 | HR 71 | Wt 228.0 lb

## 2024-02-09 DIAGNOSIS — I2510 Atherosclerotic heart disease of native coronary artery without angina pectoris: Secondary | ICD-10-CM | POA: Diagnosis present

## 2024-02-09 DIAGNOSIS — Z7984 Long term (current) use of oral hypoglycemic drugs: Secondary | ICD-10-CM | POA: Diagnosis not present

## 2024-02-09 DIAGNOSIS — E669 Obesity, unspecified: Secondary | ICD-10-CM | POA: Diagnosis not present

## 2024-02-09 DIAGNOSIS — I4819 Other persistent atrial fibrillation: Secondary | ICD-10-CM | POA: Insufficient documentation

## 2024-02-09 DIAGNOSIS — I5032 Chronic diastolic (congestive) heart failure: Secondary | ICD-10-CM | POA: Insufficient documentation

## 2024-02-09 DIAGNOSIS — Z79899 Other long term (current) drug therapy: Secondary | ICD-10-CM | POA: Diagnosis not present

## 2024-02-09 DIAGNOSIS — G4733 Obstructive sleep apnea (adult) (pediatric): Secondary | ICD-10-CM | POA: Insufficient documentation

## 2024-02-09 DIAGNOSIS — Z87891 Personal history of nicotine dependence: Secondary | ICD-10-CM | POA: Insufficient documentation

## 2024-02-09 DIAGNOSIS — I110 Hypertensive heart disease with heart failure: Secondary | ICD-10-CM | POA: Diagnosis not present

## 2024-02-09 DIAGNOSIS — Z8249 Family history of ischemic heart disease and other diseases of the circulatory system: Secondary | ICD-10-CM | POA: Diagnosis not present

## 2024-02-09 DIAGNOSIS — Z7901 Long term (current) use of anticoagulants: Secondary | ICD-10-CM | POA: Insufficient documentation

## 2024-02-09 DIAGNOSIS — I480 Paroxysmal atrial fibrillation: Secondary | ICD-10-CM

## 2024-02-09 DIAGNOSIS — I25119 Atherosclerotic heart disease of native coronary artery with unspecified angina pectoris: Secondary | ICD-10-CM

## 2024-02-09 LAB — LIPID PANEL
Cholesterol: 196 mg/dL (ref 0–200)
HDL: 58 mg/dL (ref >40)
LDL Cholesterol: 116 mg/dL — ABNORMAL HIGH (ref 0–99)
Total CHOL/HDL Ratio: 3.4 ratio
Triglycerides: 110 mg/dL (ref <150)
VLDL: 22 mg/dL (ref 0–40)

## 2024-02-09 LAB — BASIC METABOLIC PANEL WITH GFR
Anion gap: 11 (ref 5–15)
BUN: 21 mg/dL (ref 8–23)
CO2: 21 mmol/L — ABNORMAL LOW (ref 22–32)
Calcium: 9.1 mg/dL (ref 8.9–10.3)
Chloride: 108 mmol/L (ref 98–111)
Creatinine, Ser: 0.92 mg/dL (ref 0.61–1.24)
GFR, Estimated: >60 mL/min (ref >60)
Glucose, Bld: 108 mg/dL — ABNORMAL HIGH (ref 70–99)
Potassium: 4.2 mmol/L (ref 3.5–5.1)
Sodium: 140 mmol/L (ref 135–145)

## 2024-02-09 LAB — HEPATIC FUNCTION PANEL
ALT: 18 U/L (ref 0–44)
AST: 26 U/L (ref 15–41)
Albumin: 4.3 g/dL (ref 3.5–5.0)
Alkaline Phosphatase: 64 U/L (ref 38–126)
Bilirubin, Direct: 0.1 mg/dL (ref 0.0–0.2)
Indirect Bilirubin: 0.6 mg/dL (ref 0.3–0.9)
Total Bilirubin: 0.7 mg/dL (ref 0.0–1.2)
Total Protein: 7.7 g/dL (ref 6.5–8.1)

## 2024-02-09 LAB — BRAIN NATRIURETIC PEPTIDE: B Natriuretic Peptide: 89.1 pg/mL (ref 0.0–100.0)

## 2024-02-09 NOTE — Patient Instructions (Signed)
 Medication Changes:  No medication changes today!  Lab Work:  Go over to the [facility]. Go pass the gift shop and have your blood work completed.  We will only call you if the results are abnormal or if the provider would like to make medication changes.    Follow-Up in: Please follow up with the [facility] in 3 months with Dr. Shirlee Latch. We currently do not have that schedule. Please give [place] a call in June in order to schedule that appointment for July.   At the [facility], you and your health needs are our priority. We have a designated team specialized in the treatment of Heart Failure. This Care Team includes your primary Heart Failure Specialized Cardiologist (physician), Advanced Practice Providers (APPs- Physician Assistants and Nurse Practitioners), and Pharmacist who all work together to provide you with the care you need, when you need it.   You may see any of the following providers on your designated Care Team at your next follow up:  Dr. Arvilla Meres Dr. Marca Ancona Dr. Dorthula Nettles Dr. Theresia Bough Clarisa Kindred, FNP Enos Fling, RPH-CPP  Please be sure to bring in all your medications bottles to every appointment.   Need to Contact us:  If you have any questions or concerns before your next appointment please send [place] a message through [place] or call our office at [phone].    TO LEAVE A MESSAGE FOR THE NURSE SELECT OPTION 2, PLEASE LEAVE A MESSAGE INCLUDING: YOUR NAME DATE OF BIRTH CALL BACK NUMBER REASON FOR CALL**this is important as we prioritize the call backs  YOU WILL RECEIVE A CALL BACK THE SAME DAY AS LONG AS YOU CALL BEFORE 4:00 PM

## 2024-02-09 NOTE — Progress Notes (Signed)
 [facility] NOTE  Referring Physician: Barbette Reichmann, MD  Primary Care: Barbette Reichmann, MD  HPI: Shane Lamb is a 73 y.o. male with hypertension, persistent atrial fibrillation, chronic prostatitis, and osteoarthritis. Shane Lamb reports that the only cardiac history he is aware of is a diagnosis of atrial fibrillation previously followed at [facility]. Based on chart review, Shane Lamb has been diagnosed with atrial fibrillation/atrial flutter dating back to at least 2000. At that time he underwent AFL ablation at [facility]. In addition, he has undergone PVI/CSI AF ablation in 2012 and a surgical epicardial ablation for atrial fibrillation in 2017. He was previously on rhythm control with Tikosyn that he self-discontinued and since that time has been on rate control with diltiazem.  He appears to have been in atrial fibrillation since 2017.   Echo in 1/24 showed EF 55-60%, normal RV, mild-moderate MR.   He saw Dr. Lalla Brothers in 2/24 for EP evaluation.  Dr. Lalla Brothers did not think that there would be utility to repeat ablation due to low risk of maintaining NSR.  He also was interested in trying Tikosyn again, but Dr. Lalla Brothers recommended against this.   Calcium score scan 3/24 with CAC 261 Agatston units, 55th percentile.   Echo in 8/24 showed EF 55%, normal RV, biatrial enlargement, IVC normal.   Patient was seen by EP at [facility] in 1/25.  He was started back on flecainide with plan for atrial fibrillation ablation on 02/16/24.   Patient returns for followup of atrial fibrillation. He remains in coarse atrial fibrillation today, does not feel palpitations.  Weight is down by 4 lbs.  He is not taking tirzepatide now, too expensive, but is losing weight slowly with dietary changes.    Still with right knee pain but doing better with brace on.  No dyspnea with usual activities, no chest pain.  No orthopnea/PND. He is only taking Lasix prn.   Labs (1/24): LDL 105, K 5, creatinine  1.61 Labs (4/24): K 4.7, creatinine 107 Labs (8/24): K 4.6, creatinine 0.9, LDL 66, hgb 16.2 Labs (10/24): K 4.8, creatinine 1.1  ECG (personally reviewed): coarse atrial fibrillation with low voltage   Past Medical History:  Diagnosis Date   Atrial fibrillation (HCC)    Hypertension    Prostatitis, chronic     Current Outpatient Medications  Medication Sig Dispense Refill   acetaminophen (TYLENOL) 325 MG tablet Take 650 mg by mouth every 6 (six) hours as needed.     apixaban (ELIQUIS) 5 MG TABS tablet Take 5 mg by mouth 2 (two) times daily.     B Complex-C (B-COMPLEX WITH VITAMIN C) tablet Take 1 tablet by mouth daily.     buPROPion (WELLBUTRIN XL) 150 MG 24 hr tablet Take 150 mg by mouth daily.     Cholecalciferol 50 MCG (2000 UT) CAPS Take by mouth.     cyclobenzaprine (FLEXERIL) 10 MG tablet Take 1 tablet by mouth 3 (three) times daily as needed.     diltiazem (CARDIZEM CD) 240 MG 24 hr capsule Take 1 capsule (240 mg total) by mouth daily. 90 capsule 3   empagliflozin (JARDIANCE) 10 MG TABS tablet Take 1 tablet (10 mg total) by mouth daily before breakfast. 30 tablet 5   flecainide (TAMBOCOR) 50 MG tablet Take 50 mg by mouth 2 (two) times daily.     furosemide (LASIX) 20 MG tablet Take 1 tablet (20 mg total) by mouth daily. 90 tablet 3   HYDROcodone-acetaminophen (NORCO) 10-325 MG tablet  Take 1 tablet by mouth 2 (two) times daily as needed for severe pain.     lisinopril (ZESTRIL) 5 MG tablet Take 1 tablet (5 mg total) by mouth daily. 90 tablet 3   lithium 300 MG tablet Take 300 mg by mouth 3 (three) times daily.     Magnesium 200 MG TABS Take 2 tablets by mouth daily.     methocarbamol (ROBAXIN) 500 MG tablet Take 500 mg by mouth every 8 (eight) hours as needed.     metoprolol succinate (TOPROL XL) 50 MG 24 hr tablet Take 1 tablet (50 mg total) by mouth daily. 90 tablet 3   Omega-3 1000 MG CAPS Take by mouth.     oxybutynin (DITROPAN) 5 MG tablet Take 5 mg by mouth 2 (two)  times daily.     potassium chloride (KLOR-CON) 10 MEQ tablet Take 1 tablet (10 mEq total) by mouth daily. 90 tablet 3   rosuvastatin (CRESTOR) 10 MG tablet Take 1 tablet (10 mg total) by mouth daily. 90 tablet 3   sertraline (ZOLOFT) 100 MG tablet Take 100 mg by mouth daily.     sildenafil (VIAGRA) 100 MG tablet Take 100 mg by mouth daily as needed for erectile dysfunction.     tamsulosin (FLOMAX) 0.4 MG CAPS capsule Take 0.4 mg by mouth daily.     tirzepatide [facility]) 5 MG/0.5ML Pen Inject 5 mg into the skin once a week. 2 mL 0   tiZANidine (ZANAFLEX) 2 MG tablet Take by mouth.     traZODone (DESYREL) 50 MG tablet Take 50 mg by mouth at bedtime as needed.     Vibegron (GEMTESA) 75 MG TABS Take 1 tablet (75 mg total) by mouth daily. 30 tablet 11   No current facility-administered medications for this visit.    Allergies  Allergen Reactions   Amiodarone Nausea And Vomiting      Social History   Socioeconomic History   Marital status: Married    Spouse name: Not on file   Number of children: Not on file   Years of education: Not on file   Highest education level: Not on file  Occupational History   Occupation: retired  Tobacco Use   Smoking status: Former    Current packs/day: 0.00    Types: Cigarettes    Quit date: 1975    Years since quitting: 50.2   Smokeless tobacco: Former    Quit date: 1974  Substance and Sexual Activity   Alcohol use: Yes   Drug use: No   Sexual activity: Yes  Other Topics Concern   Not on file  Social History Narrative   Not on file   Social Drivers of Health   Financial Resource Strain: Low Risk  (04/23/2023)   Received from [facility]   Overall Financial Resource Strain (CARDIA)    Difficulty of Paying Living Expenses: Not hard at all  Food Insecurity: No Food Insecurity (04/23/2023)   Received from [facility]   Hunger Vital Sign    Worried About Running Out of Food in the Last Year: Never true     Ran Out of Food in the Last Year: Never true  Transportation Needs: No Transportation Needs (04/23/2023)   Received from [facility]   PRAPARE - Transportation    In the past 12 months, has lack of transportation kept you from medical appointments or from getting medications?: No    Lack of Transportation (Non-Medical): No  Physical Activity: Not  on file  Stress: Not on file  Social Connections: Not on file  Intimate Partner Violence: Not on file      Family History  Problem Relation Age of Onset   Hypertension Father    Depression Father     PHYSICAL EXAM: Vitals:   02/09/24 1554  BP: 119/85  Pulse: 71  SpO2: 96%   General: NAD Neck: No JVD, no thyromegaly or thyroid nodule.  Lungs: Clear to auscultation bilaterally with normal respiratory effort. CV: Nondisplaced PMI.  Heart irregular S1/S2, no S3/S4, no murmur.  No peripheral edema.  No carotid bruit.  Normal pedal pulses.  Abdomen: Soft, nontender, no hepatosplenomegaly, no distention.  Skin: Intact without lesions or rashes.  Neurologic: Alert and oriented x 3.  Psych: Normal affect. Extremities: No clubbing or cyanosis.  HEENT: Normal.   DATA REVIEW  ECHO: 06/25/21 ([facility]): LVEF >55%, mild LVH 06/05/20 ([facility]): LVEF >55%, normal RV function. 1/24:  Echo in 1/24 showed EF 55-60%, normal RV, mild-moderate MR. 8/24: Echo showed EF 55%, normal RV, biatrial enlargement, IVC normal.  Myocardial perfusion / Lexiscan ([facility]): (06/05/2020) SPECT images demonstrate homogeneous tracer distribution throughout the myocardium. Defect type:  Normal   ABIs normal in 2/22   ASSESSMENT & PLAN:  1. Atrial fibrillation: As per chart review, hx of atrial flutter ablation in 10/1999 & atrial fibrillation ablation (PVI & CSI) in 2012.  Surgical epicardial ablation in 06/2016.  Previously on Tikosyn but stopped in the past. Persistent AF since at least 2019 based on ECGs in our system.  Coarse AF again today on ECG. Saw  Dr. Lalla Brothers with EP, did not think that repeat ablation would be successful. Patient also asked about trying Tikosyn again.  I spoke with Dr. Lalla Brothers about this as well and he did not think this was likely to be successful given the amount of time he has been in atrial fibrillation. However, his atria are not markedly dilated by echo.  He failed flecainide and amiodarone due to adverse side effects (severe nausea with amiodarone). Since last appointment, he was seen by EP at [facility] and was started back on flecainide 50 mg bid and planned for redo ablation on 02/16/24. Rate is controlled today.  - Continue flecainide, has tolerated well so far. ECG stable today.  - Continue apixaban 5mg  BID.  - Continue Toprol XL 50 mg daily and diltiazem CD 240 mg daily.    2. HTN: BP controlled on current regimen.   3. CAD:  Calcium score 261 Agatston units in 3/24.  No chest pain.  - Continue Crestor, check lipids today.  - No ASA given apixaban use.  4. Chronic diastolic CHF:  Echo in 8/24 showed EF 55%, normal RV, biatrial enlargement, IVC normal.  He is not volume overloaded on exam.  NYHA class I-II.  - He can continue to use Lasix prn, has rarely needed.  - Continue Jardiance 10 mg daily.  5. OSA: Continue CPAP.  6. Obesity: Now off Mounjaro, still losing weight slowly with diet.   Followup 3 months with me.   I spent 31 minutes reviewing records, interviewing/examining patient, and managing orders.   Marca Ancona 02/09/2024

## 2024-02-11 ENCOUNTER — Telehealth: Payer: Self-pay

## 2024-02-11 MED ORDER — ROSUVASTATIN CALCIUM 20 MG PO TABS: 20.0000 mg | ORAL_TABLET | Freq: Every day | ORAL | 3 refills | Status: AC

## 2024-02-11 NOTE — Telephone Encounter (Signed)
-----   Message from Marca Ancona sent at 02/09/2024  6:04 PM EDT ----- I'd like to see his LDL lower given abnormal calcium score.  Increase Crestor to 20 mg daily, lipids/LFTs in 2 months.

## 2024-02-20 ENCOUNTER — Encounter: Payer: Self-pay | Admitting: *Deleted

## 2024-02-23 ENCOUNTER — Other Ambulatory Visit: Payer: Self-pay

## 2024-03-29 ENCOUNTER — Telehealth: Payer: Medicare (Managed Care)

## 2024-03-29 ENCOUNTER — Telehealth: Payer: Self-pay | Admitting: *Deleted

## 2024-03-29 NOTE — Telephone Encounter (Signed)
   Pre-operative Risk Assessment    Patient Name: Shane Lamb  DOB: [birth year] MRN: [id number]   Date of last office visit: 02/09/24 DR. MCLEAN Date of next office visit: NONE   Request for Surgical Clearance    Procedure:  PERIPHERAL NERVE STIMULATOR TRIAL  Date of Surgery:  Clearance 04/02/24                                Surgeon:  NOT INDICATED  Surgeon's Group or Practice Name:  [facility] Phone number:  [phone] [name] Fax number:  [phone]   Type of Clearance Requested:   - Medical  - Pharmacy:  Hold Apixaban (Eliquis) x 2 DAYS PRIOR, RESUME 24 HOURS POST PROCEDURE   Type of Anesthesia:  IV SEDATION   Additional requests/questions:    Signed, Govind Furey   03/29/2024, 12:29 PM

## 2024-03-29 NOTE — Telephone Encounter (Signed)
 Patient with diagnosis of PAF on Eliquis for anticoagulation.    Procedure: PERIPHERAL NERVE STIMULATOR TRIAL  Date of procedure: 04/02/24   CHA2DS2-VASc Score = 3   This indicates a 3.2% annual risk of stroke. The patient's score is based upon: CHF History: 0 HTN History: 1 Diabetes History: 0 Stroke History: 0 Vascular Disease History: 1 Age Score: 1 Gender Score: 0     CrCl 89 mL/min Platelet count 163 K  Patient has not  had an Afib/aflutter ablation within the last 3 months or DCCV within the last 30 days   Per office protocol, patient can hold Eliquis for 2 days prior to procedure.     **This guidance is not considered finalized until pre-operative APP has relayed final recommendations.**

## 2024-03-29 NOTE — Telephone Encounter (Signed)
 Left message for pt to call back and schedule tele preop appt. Ok to be added on tomorrow per preop APP Palmer Bobo, NP.

## 2024-03-29 NOTE — Telephone Encounter (Signed)
   Name: Shane Lamb  DOB: 1950-11-25  MRN: [id number]  Primary Cardiologist: None   Preoperative team, please contact this patient and set up a phone call appointment for further preoperative risk assessment. Please obtain consent and complete medication review. Thank you for your help.  I confirm that guidance regarding antiplatelet and oral anticoagulation therapy has been completed and, if necessary, noted below.  Per office protocol, patient can hold Eliquis for 2 days prior to procedure.    I also confirmed the patient resides in the state of [place] . As per [facility] Medical Board telemedicine laws, the patient must reside in the state in which the provider is licensed.   Ava Boatman, NP 03/29/2024, 4:25 PM [facility]

## 2024-03-30 ENCOUNTER — Ambulatory Visit: Payer: Medicare (Managed Care) | Attending: Emergency Medicine | Admitting: Emergency Medicine

## 2024-03-30 ENCOUNTER — Telehealth: Payer: Self-pay | Admitting: *Deleted

## 2024-03-30 DIAGNOSIS — Z01810 Encounter for preprocedural cardiovascular examination: Secondary | ICD-10-CM

## 2024-03-30 NOTE — Telephone Encounter (Signed)
 Pt returned call

## 2024-03-30 NOTE — Telephone Encounter (Signed)
 Pt called back and he has been scheduled tele preop add on today 3 pm per Sheryle Donning, NP.   Med rec and consent are done.

## 2024-03-30 NOTE — Telephone Encounter (Signed)
 Spoke with pt regarding a virtual visit for his upcoming surgery on 04/02/24. Pt stated he would call me back with to schedule his virtual visit.

## 2024-03-30 NOTE — Progress Notes (Signed)
 Virtual Visit via Telephone Note   Because of BHAVESH Lamb co-morbid illnesses, he is at least at moderate risk for complications without adequate follow up.  This format is felt to be most appropriate for this patient at this time.  Due to technical limitations with video connection (technology), today's appointment will be conducted as an audio only telehealth visit, and [name] verbally agreed to proceed in this manner.   All issues noted in this document were discussed and addressed.  No physical exam could be performed with this format.  Evaluation Performed:  Preoperative cardiovascular risk assessment _____________   Date:  03/30/2024   Patient ID:  Shane Lamb, DOB 08-22-1951, MRN [id number] Patient Location:  Home Provider location:   Office  Primary Care Provider:  Antonio Baumgarten, MD Primary Cardiologist:  Peder Bourdon, MD  Chief Complaint / Patient Profile   73 y.o. y/o male with a h/o atrial fibrillation, hypertension, coronary artery disease, chronic diastolic CHF, obstructive sleep apnea, obesity who is pending peripheral nerve stimulator trial on 04/02/2024 with [facility] and presents today for telephonic preoperative cardiovascular risk assessment.  History of Present Illness    Shane Lamb is a 73 y.o. male who presents via audio/video conferencing for a telehealth visit today.  Pt was last seen in cardiology clinic on 02/09/2024 by Dr. Mitzie Anda.  At that time EVERET Lamb was doing well.  The patient is now pending procedure as outlined above. Since his last visit, he denies chest pain, shortness of breath, lower extremity edema, fatigue, palpitations, melena, hematuria, hemoptysis, diaphoresis, weakness, presyncope, syncope, orthopnea, and PND.  Today patient is doing well overall.  He denies any acute cardiovascular concerns or complaints at this time.  Notes he is euvolemic and not having to use as needed loop diuretic therapy.  Denies any  symptoms of tachycardia or palpitations that would be associated with any RVR.  Blood pressure seems to be under well control.  He denies any chest pain or exertional angina.  Notes that he does stay fairly active however does suffer from knee pain and lower back pain.  He still able to do yard work, housework, walk a block and walk up stairs.  He is able to achieve greater than 4 METS.  Of note he is also scheduled on 05/31/2024 for A-fib ablation with watchman with [facility].  Past Medical History    Past Medical History:  Diagnosis Date   Atrial fibrillation (HCC)    Hypertension    Prostatitis, chronic    Past Surgical History:  Procedure Laterality Date   CARDIAC ELECTROPHYSIOLOGY MAPPING AND ABLATION     JOINT REPLACEMENT     hip replacement   REPLACEMENT TOTAL KNEE      Allergies  Allergies  Allergen Reactions   Amiodarone Nausea And Vomiting    Home Medications    Prior to Admission medications   Medication Sig Start Date End Date Taking? Authorizing Provider  acetaminophen  (TYLENOL ) 325 MG tablet Take 650 mg by mouth every 6 (six) hours as needed.    [provider]  apixaban (ELIQUIS) 5 MG TABS tablet Take 5 mg by mouth 2 (two) times daily. 11/28/21   [provider]  B Complex-C (B-COMPLEX WITH VITAMIN C) tablet Take 1 tablet by mouth daily.    [provider]  buPROPion (WELLBUTRIN XL) 150 MG 24 hr tablet Take 150 mg by mouth daily.    [provider]  Cholecalciferol 50 MCG (2000 UT) CAPS  Take by mouth.    [provider]  cyclobenzaprine (FLEXERIL) 10 MG tablet Take 1 tablet by mouth 3 (three) times daily as needed. 08/17/20   [provider]  diltiazem  (CARDIZEM  CD) 240 MG 24 hr capsule Take 1 capsule (240 mg total) by mouth daily. 10/08/23 02/09/24  Darlis Eisenmenger, MD  empagliflozin  (JARDIANCE ) 10 MG TABS tablet Take 1 tablet (10 mg total) by mouth daily before breakfast. 11/18/23   Margean Sheehan, RPH-CPP  flecainide  (TAMBOCOR) 50 MG tablet Take 50 mg by mouth 2 (two) times daily.    [provider]  furosemide  (LASIX ) 20 MG tablet Take 1 tablet (20 mg total) by mouth daily. 05/22/23   Darlis Eisenmenger, MD  HYDROcodone-acetaminophen  [facility]) 10-325 MG tablet Take 1 tablet by mouth 2 (two) times daily as needed for severe pain.    [provider]  lisinopril  (ZESTRIL ) 5 MG tablet Take 1 tablet (5 mg total) by mouth daily. 05/22/23   Darlis Eisenmenger, MD  lithium  300 MG tablet Take 300 mg by mouth 3 (three) times daily.    [provider]  Magnesium  200 MG TABS Take 2 tablets by mouth daily.    [provider]  methocarbamol  (ROBAXIN ) 500 MG tablet Take 500 mg by mouth every 8 (eight) hours as needed.    [provider]  metoprolol  succinate (TOPROL  XL) 50 MG 24 hr tablet Take 1 tablet (50 mg total) by mouth daily. 10/08/23   Darlis Eisenmenger, MD  Omega-3 1000 MG CAPS Take by mouth.    [provider]  oxybutynin (DITROPAN) 5 MG tablet Take 5 mg by mouth 2 (two) times daily. 07/07/23   [provider]  potassium chloride  (KLOR-CON ) 10 MEQ tablet Take 1 tablet (10 mEq total) by mouth daily. 05/22/23   Darlis Eisenmenger, MD  rosuvastatin  (CRESTOR ) 20 MG tablet Take 1 tablet (20 mg total) by mouth daily. 02/11/24 05/11/24  Darlis Eisenmenger, MD  sertraline  (ZOLOFT ) 100 MG tablet Take 100 mg by mouth daily.    [provider]  sildenafil (VIAGRA) 100 MG tablet Take 100 mg by mouth daily as needed for erectile dysfunction.    [provider]  tamsulosin (FLOMAX) 0.4 MG CAPS capsule Take 0.4 mg by mouth daily.    [provider]  tirzepatide  (MOUNJARO ) 5 MG/0.5ML Pen Inject 5 mg into the skin once a week. 09/02/23   Darlis Eisenmenger, MD  tiZANidine (ZANAFLEX) 2 MG tablet Take by mouth. 04/21/23   [provider]  traZODone  (DESYREL ) 50 MG tablet Take 50 mg by mouth at bedtime as needed. 09/12/23   [provider]  Vibegron   (GEMTESA ) 75 MG TABS Take 1 tablet (75 mg total) by mouth daily. 11/17/23   Geraline Knapp, MD    Physical Exam    Vital Signs:  FINNEAS MATHE does not have vital signs available for review today.  Given telephonic nature of communication, physical exam is limited. AAOx3. NAD. Normal affect.  Speech and respirations are unlabored.  Accessory Clinical Findings    None  Assessment & Plan    1.  Preoperative Cardiovascular Risk Assessment: According to the Revised Cardiac Risk Index (RCRI), his Perioperative Risk of Major Cardiac Event is (%): 0.9. His Functional Capacity in METs is: 5.62 according to the [facility] Activity Status Index (DASI). Therefore, based on ACC/AHA guidelines, patient would be at acceptable risk for the planned procedure without further cardiovascular testing.   The  patient was advised that if he develops new symptoms prior to surgery to contact our office to arrange for a follow-up visit, and he verbalized understanding.  Per office protocol, patient can hold Eliquis for 2 days prior to procedure.    A copy of this note will be routed to requesting surgeon.  Time:   Today, I have spent 9 minutes with the patient with telehealth technology discussing medical history, symptoms, and management plan.     Ava Boatman, NP  03/30/2024, 3:17 PM

## 2024-03-30 NOTE — Telephone Encounter (Signed)
 Pt requesting a c/b from the nurse he states he doesn't answer certain calls so leave a VM.

## 2024-04-01 ENCOUNTER — Other Ambulatory Visit: Payer: Self-pay

## 2024-04-06 ENCOUNTER — Other Ambulatory Visit: Payer: Self-pay

## 2024-04-07 ENCOUNTER — Telehealth: Payer: Self-pay

## 2024-04-07 MED ORDER — MOUNJARO 2.5 MG/0.5ML ~~LOC~~ SOAJ: 2.5000 mg | SUBCUTANEOUS | 3 refills | Status: DC

## 2024-04-07 NOTE — Progress Notes (Signed)
 Pt called asking for a new rx for Mounjaro  be sent to [facility]. Pt states he has been off of it due to insurance and expense issues. Pt states his insurance is back and he can start back on the prescription and wants 10 MG sent in. Notified pt that I need to speak with the pharmacist first because he has been off of the medication for a while and may have to start at a different dosage.  Spoke with Nason Wise, RPH-CPP, pt needs to start back at 2.5 MG due to being off of the medication for so long. If he starts at a higher dosage, he is more likely to be intolerant and have severe side effects. Nason stated he would call him in 3 weeks to go over titration with the patient.  Sent in rx to [facility] pharmacy. Called and notified pt regarding dosage. Pt aware and agreed to start at 2.5 MG per pharmacist recommendations.

## 2024-04-13 NOTE — Progress Notes (Unsigned)
 ADVANCED HEART FAILURE CLINIC NOTE  Referring Physician: Antonio Baumgarten, MD  Primary Care: Antonio Baumgarten, MD HF Provider: Peder Bourdon, MD  Chief Complaint: shortness of breath  HPI: Shane Lamb is a 73 y.o. male with hypertension, persistent atrial fibrillation, chronic prostatitis, and osteoarthritis. Shane Lamb reports that the only cardiac history he is aware of is a diagnosis of atrial fibrillation previously followed at [facility]. Based on chart review, Shane Lamb has been diagnosed with atrial fibrillation/atrial flutter dating back to at least 2000. At that time he underwent AFL ablation at [facility]. In addition, he has undergone PVI/CSI AF ablation in 2012 and a surgical epicardial ablation for atrial fibrillation in 2017. He was previously on rhythm control with Tikosyn  that he self-discontinued and since that time has been on rate control with diltiazem .  He appears to have been in atrial fibrillation since 2017.   Echo in 1/24 showed EF 55-60%, normal RV, mild-moderate MR.   He saw Dr. Marven Slimmer in 2/24 for EP evaluation.  Dr. Marven Slimmer did not think that there would be utility to repeat ablation due to low risk of maintaining NSR.  He also was interested in trying Tikosyn  again, but Dr. Marven Slimmer recommended against this.   Calcium  score scan 3/24 with CAC 261 Agatston units, 55th percentile.   Echo in 8/24 showed EF 55%, normal RV, biatrial enlargement, IVC normal.   Patient was seen by EP at [facility] in 1/25.  He was started back on flecainide with plan for atrial fibrillation ablation on 02/16/24.   Had telemedicine visit with EP 05/25 to discuss rhythm control. Patient had cancelled 04/25 ablation & tikosyn  load.   Patient returns for an acute visit with a chief complaint of shortness of breath with exertion (worsening over last few days). Has associated fatigue, occasional dizziness, pedal edema. Due to increasing edema, he self titrated his furosemide  to 100mg  daily over the  last 3-4 days without any improvement. Wearing CPAP nightly. Denies chest pain, cough, palpitations or difficulty sleeping. Currently has ablation scheduled for 05/31/24. Not weighing daily but knows that he's gained a lot of weight.   Admits to eating out more often and eating more salty foods recently. Been eating at [employer] and eating "a lot" of mac & cheese. He's also been drinking "more than I should" and estimates that he drinks at least 1/2 gallon of milk daily along with water.    Labs (1/24): LDL 105, K 5, creatinine 0.87 Labs (4/24): K 4.7, creatinine 107 Labs (8/24): K 4.6, creatinine 0.9, LDL 66, hgb 16.2 Labs (10/24): K 4.8, creatinine 1.1 Labs (3/25): K 4.2, creatinine 0.92, BNP 89.1  ECG (personally reviewed): atrial fibrillation, HR 84   Past Medical History:  Diagnosis Date   Atrial fibrillation (HCC)    Hypertension    Prostatitis, chronic     Current Outpatient Medications  Medication Sig Dispense Refill   acetaminophen  (TYLENOL ) 325 MG tablet Take 650 mg by mouth every 6 (six) hours as needed.     apixaban (ELIQUIS) 5 MG TABS tablet Take 5 mg by mouth 2 (two) times daily.     B Complex-C (B-COMPLEX WITH VITAMIN C) tablet Take 1 tablet by mouth daily.     buPROPion (WELLBUTRIN XL) 150 MG 24 hr tablet Take 150 mg by mouth daily.     Cholecalciferol 50 MCG (2000 UT) CAPS Take by mouth.     cyclobenzaprine (FLEXERIL) 10 MG tablet Take 1 tablet by mouth 3 (three) times daily as needed.  diltiazem  (CARDIZEM  CD) 240 MG 24 hr capsule Take 1 capsule (240 mg total) by mouth daily. 90 capsule 3   empagliflozin  (JARDIANCE ) 10 MG TABS tablet Take 1 tablet (10 mg total) by mouth daily before breakfast. 30 tablet 5   flecainide (TAMBOCOR) 50 MG tablet Take 50 mg by mouth 2 (two) times daily.     furosemide  (LASIX ) 20 MG tablet Take 1 tablet (20 mg total) by mouth daily. 90 tablet 3   HYDROcodone-acetaminophen  (NORCO) 10-325 MG tablet Take 1 tablet by mouth 2 (two) times  daily as needed for severe pain.     lisinopril  (ZESTRIL ) 5 MG tablet Take 1 tablet (5 mg total) by mouth daily. 90 tablet 3   lithium  300 MG tablet Take 300 mg by mouth 3 (three) times daily.     Magnesium  200 MG TABS Take 2 tablets by mouth daily.     methocarbamol  (ROBAXIN ) 500 MG tablet Take 500 mg by mouth every 8 (eight) hours as needed.     metoprolol  succinate (TOPROL  XL) 50 MG 24 hr tablet Take 1 tablet (50 mg total) by mouth daily. 90 tablet 3   Omega-3 1000 MG CAPS Take by mouth.     oxybutynin (DITROPAN) 5 MG tablet Take 5 mg by mouth 2 (two) times daily.     potassium chloride  (KLOR-CON ) 10 MEQ tablet Take 1 tablet (10 mEq total) by mouth daily. 90 tablet 3   rosuvastatin  (CRESTOR ) 20 MG tablet Take 1 tablet (20 mg total) by mouth daily. 90 tablet 3   sertraline  (ZOLOFT ) 100 MG tablet Take 100 mg by mouth daily.     sildenafil (VIAGRA) 100 MG tablet Take 100 mg by mouth daily as needed for erectile dysfunction.     tamsulosin (FLOMAX) 0.4 MG CAPS capsule Take 0.4 mg by mouth daily.     tirzepatide  (MOUNJARO ) 2.5 MG/0.5ML Pen Inject 2.5 mg into the skin once a week. 2 mL 3   tiZANidine (ZANAFLEX) 2 MG tablet Take by mouth.     traZODone  (DESYREL ) 50 MG tablet Take 50 mg by mouth at bedtime as needed.     Vibegron  (GEMTESA ) 75 MG TABS Take 1 tablet (75 mg total) by mouth daily. 30 tablet 11   No current facility-administered medications for this visit.    Allergies  Allergen Reactions   Amiodarone Nausea And Vomiting      Social History   Socioeconomic History   Marital status: Married    Spouse name: Not on file   Number of children: Not on file   Years of education: Not on file   Highest education level: Not on file  Occupational History   Occupation: retired  Tobacco Use   Smoking status: Former    Current packs/day: 0.00    Types: Cigarettes    Quit date: 1975    Years since quitting: 50.4   Smokeless tobacco: Former    Quit date: 1974  Substance and Sexual  Activity   Alcohol  use: Yes   Drug use: No   Sexual activity: Yes  Other Topics Concern   Not on file  Social History Narrative   Not on file   Social Drivers of Health   Financial Resource Strain: Low Risk  (03/23/2024)   Received [facility]   Overall Financial Resource Strain (CARDIA)    Difficulty of Paying Living Expenses: Not very hard  Food Insecurity: Food Insecurity Present (03/23/2024)   Received from [facility]   Hunger Vital  Sign    Worried About Programme researcher, broadcasting/film/video in the Last Year: Sometimes true    Ran Out of Food in the Last Year: Patient declined  Transportation Needs: No Transportation Needs (03/23/2024)   Received from [facility]   PRAPARE - Transportation    Lack of Transportation (Medical): No    Lack of Transportation (Non-Medical): No  Physical Activity: Not on file  Stress: Not on file  Social Connections: Not on file  Intimate Partner Violence: Not on file      Family History  Problem Relation Age of Onset   Hypertension Father    Depression Father    Vitals:   04/14/24 0952  BP: 111/78  Pulse: 84  SpO2: 95%  Weight: 253 lb (114.8 kg)   Wt Readings from Last 3 Encounters:  04/14/24 253 lb (114.8 kg)  02/09/24 228 lb (103.4 kg)  10/22/23 237 lb 3.2 oz (107.6 kg)   Lab Results  Component Value Date   CREATININE 0.92 02/09/2024   CREATININE 1.02 05/22/2023   CREATININE 0.87 11/25/2022   ReDs reading: 38 %, abnormal    PHYSICAL EXAM:  General: Well appearing. No resp difficulty HEENT: normal Neck: supple, elevated JVD Cor: Irregular rhythm, rate. No rubs, gallops or murmurs Lungs: clear Abdomen: soft, nontender, distended. Extremities: no cyanosis, clubbing, rash, 3+ pitting edema right lower leg up to knee, 2+ pitting edema left lower leg to knee Neuro: alert & oriented X 3. Moves all 4 extremities w/o difficulty. Affect pleasant   DATA REVIEW  ECHO: 06/25/21 ([facility]): LVEF >55%, mild LVH 06/05/20 ([facility]):  LVEF >55%, normal RV function. 1/24:  Echo in 1/24 showed EF 55-60%, normal RV, mild-moderate MR. 8/24: Echo showed EF 55%, normal RV, biatrial enlargement, IVC normal.  Myocardial perfusion / Lexiscan ([facility]): (06/05/2020) SPECT images demonstrate homogeneous tracer distribution throughout the myocardium. Defect type:  Normal   ABIs normal in 2/22   ASSESSMENT & PLAN:  1. Chronic diastolic CHF:  Echo in 8/24 showed EF 55%, normal RV, biatrial enlargement, IVC normal.   - moderately volume up with weight gain and worsening edema - ReDs slightly elevated at 38% - weight up 25 pounds from last visit here 2 months ago; emphasized weighing daily at home - consulted with Dr. Bensimhon about treating w/ furoscix  vs admission; discussed both options with patient - use 80mg  furoscix  for the next 3 days; with each dose of furoscix , take 60meq extra potassium; do not take oral diuretic while using furoscix  - for next 2 days take metolazone 2.5mg  daily - once euvolemic plan to change furosemide  to 40mg  torsemide daily - Continue Jardiance  10 mg daily.  - discussed decreasing fluid intake and sodium intake (has been eating mac/ cheese and McDonald[employer]) - BMET/ BNP today; repeat BMET at next visit - high risk for admission - compression socks 2. Atrial fibrillation: As per chart review, hx of atrial flutter ablation in 10/1999 & atrial fibrillation ablation (PVI & CSI) in 2012.  Surgical epicardial ablation in 06/2016.  Previously on Tikosyn  but stopped in the past. Persistent AF since at least 2019 based on ECGs in our system.  Coarse AF again today on ECG. Saw Dr. Marven Slimmer with EP, did not think that repeat ablation would be successful. Patient also asked about trying Tikosyn  again.  I spoke with Dr. Marven Slimmer about this as well and he did not think this was likely to be successful given the amount of time he has been in atrial fibrillation. However, his  atria are not markedly dilated by echo.  He failed  flecainide and amiodarone due to adverse side effects (severe nausea with amiodarone). Since last appointment, he was seen by EP at [facility] and was started back on flecainide 50 mg bid and planned for redo ablation on 02/16/24 which patient declined. Had spoken with EP last month (05/25) and now plans for ablation 07/25 - Continue flecainide 50mg  BID - Continue apixaban 5mg  BID.  - Continue Toprol  XL 50 mg daily and diltiazem  CD 240 mg daily.    - EKG today is AF 3. HTN: BP controlled on current regimen.   4. CAD:  Calcium  score 261 Agatston units in 3/24.  No chest pain.  - Continue Crestor  20mg  daily (recently increased in March) - LDL 02/09/24 was 116; recheck in a couple of months; reviewed less fatty foods - No ASA given apixaban use.  5. OSA: Continue CPAP.  6. Obesity: BMI 33.38 kg/ m2  Return in 2 days, sooner if needed.   Charlette Console 04/13/2024  Patient seen and examined with the above-signed Advanced Practice Provider and/or Housestaff. I personally reviewed laboratory data, imaging studies and relevant notes. I independently examined the patient and formulated the important aspects of the plan. I have edited the note to reflect any of my changes or salient points. I have personally discussed the plan with the patient and/or family.  73 y/o male with diastolic HF, PAF, OSA seen today with > 20 pound volume overload in setting of dietary indiscretion. R>L HF symptoms ReDS 38% Poor response to increasing po diuretics.   General:  Obese male sitting up in clinic  No resp difficulty HEENT: normal Neck: supple. JVP to ear Carotids 2+ bilat; no bruits. No lymphadenopathy or thryomegaly appreciated. Cor: PMI nondisplaced. Regular rate & rhythm. No rubs, gallops or murmurs. Lungs: clear Abdomen: obese soft, nontender, nondistended. No hepatosplenomegaly. No bruits or masses. Good bowel sounds. Extremities: no cyanosis, clubbing, rash, 2-3+ edema R>L  Neuro: alert & orientedx3, cranial  nerves grossly intact. moves all 4 extremities w/o difficulty. Affect pleasant  He has marked volume overload but respiratory status stable.   Will use Furoscix  80 and metolazone 2.5 daily x 2 days. See back Friday. If no response will need admission.   I spent a total of 45 minutes today: 1) reviewing the patient's medical records including previous charts, labs and recent notes from other providers; 2) examining the patient and counseling them on their medical issues/explaining the plan of care; 3) adjusting meds as needed and 4) ordering lab work or other needed tests.   Jules Oar, MD  5:35 PM

## 2024-04-14 ENCOUNTER — Encounter: Payer: Self-pay | Admitting: Family

## 2024-04-14 ENCOUNTER — Telehealth: Payer: Self-pay | Admitting: Family

## 2024-04-14 ENCOUNTER — Ambulatory Visit: Payer: Medicare (Managed Care) | Attending: Family | Admitting: Family

## 2024-04-14 VITALS — BP 111/78 | HR 84 | Wt 253.0 lb

## 2024-04-14 DIAGNOSIS — I480 Paroxysmal atrial fibrillation: Secondary | ICD-10-CM | POA: Diagnosis not present

## 2024-04-14 DIAGNOSIS — I110 Hypertensive heart disease with heart failure: Secondary | ICD-10-CM | POA: Diagnosis not present

## 2024-04-14 DIAGNOSIS — I4892 Unspecified atrial flutter: Secondary | ICD-10-CM | POA: Insufficient documentation

## 2024-04-14 DIAGNOSIS — I4819 Other persistent atrial fibrillation: Secondary | ICD-10-CM | POA: Insufficient documentation

## 2024-04-14 DIAGNOSIS — Z79899 Other long term (current) drug therapy: Secondary | ICD-10-CM | POA: Diagnosis not present

## 2024-04-14 DIAGNOSIS — R0602 Shortness of breath: Secondary | ICD-10-CM | POA: Insufficient documentation

## 2024-04-14 DIAGNOSIS — Z87891 Personal history of nicotine dependence: Secondary | ICD-10-CM | POA: Diagnosis not present

## 2024-04-14 DIAGNOSIS — I5032 Chronic diastolic (congestive) heart failure: Secondary | ICD-10-CM | POA: Diagnosis not present

## 2024-04-14 DIAGNOSIS — I2510 Atherosclerotic heart disease of native coronary artery without angina pectoris: Secondary | ICD-10-CM | POA: Diagnosis not present

## 2024-04-14 DIAGNOSIS — R609 Edema, unspecified: Secondary | ICD-10-CM | POA: Insufficient documentation

## 2024-04-14 DIAGNOSIS — E669 Obesity, unspecified: Secondary | ICD-10-CM

## 2024-04-14 DIAGNOSIS — I10 Essential (primary) hypertension: Secondary | ICD-10-CM | POA: Diagnosis not present

## 2024-04-14 DIAGNOSIS — N411 Chronic prostatitis: Secondary | ICD-10-CM | POA: Insufficient documentation

## 2024-04-14 DIAGNOSIS — Z6833 Body mass index (BMI) 33.0-33.9, adult: Secondary | ICD-10-CM | POA: Insufficient documentation

## 2024-04-14 DIAGNOSIS — Z7984 Long term (current) use of oral hypoglycemic drugs: Secondary | ICD-10-CM | POA: Insufficient documentation

## 2024-04-14 DIAGNOSIS — Z7901 Long term (current) use of anticoagulants: Secondary | ICD-10-CM | POA: Insufficient documentation

## 2024-04-14 DIAGNOSIS — G4733 Obstructive sleep apnea (adult) (pediatric): Secondary | ICD-10-CM

## 2024-04-14 MED ORDER — METOLAZONE 2.5 MG PO TABS: 2.5000 mg | ORAL_TABLET | Freq: Every day | ORAL | 0 refills | Status: DC

## 2024-04-14 MED ORDER — POTASSIUM CHLORIDE CRYS ER 20 MEQ PO TBCR
60.0000 meq | EXTENDED_RELEASE_TABLET | Freq: Every day | ORAL | 0 refills | Status: DC
Start: 2024-04-14 — End: 2024-04-19

## 2024-04-14 NOTE — Patient Instructions (Signed)
 Medication Changes:  TAKE FUROSCIX  TODAY, THURSDAY, AND FRIDAY  Take Metolazone 2.5 MG with the Furoscix  Thursday and Friday  Take Potassium 60 MEQ with EACH dose of Furoscix   DO NOT TAKE YOUR FUROSEMIDE  WITH THE FUROSCIX    Lab Work:  Go DOWN to LOWER LEVEL (LL) to have your blood work completed inside of [employer] office.  We will only call you if the results are abnormal or if the provider would like to make medication changes.   Follow-Up in: We will call you.  At the [facility], you and your health needs are our priority. We have a designated team specialized in the treatment of Heart Failure. This Care Team includes your primary Heart Failure Specialized Cardiologist (physician), Advanced Practice Providers (APPs- Physician Assistants and Nurse Practitioners), and Pharmacist who all work together to provide you with the care you need, when you need it.   You may see any of the following providers on your designated Care Team at your next follow up:  Dr. Jules Oar Dr. Peder Bourdon Dr. Alwin Baars Dr. Judyth Nunnery Shawnee Dellen, FNP Bevely Brush, RPH-CPP  Please be sure to bring in all your medications bottles to every appointment.   Need to Contact Us :  If you have any questions or concerns before your next appointment please send us  a message through [place] or call our office at [phone].    TO LEAVE A MESSAGE FOR THE NURSE SELECT OPTION 2, PLEASE LEAVE A MESSAGE INCLUDING: YOUR NAME DATE OF BIRTH CALL BACK NUMBER REASON FOR CALL**this is important as we prioritize the call backs  YOU WILL RECEIVE A CALL BACK THE SAME DAY AS LONG AS YOU CALL BEFORE 4:00 PM

## 2024-04-14 NOTE — Progress Notes (Signed)
 ReDS Vest / Clip - 04/14/24 1000       ReDS Vest / Clip   Station Marker D    Ruler Value 33    ReDS Value Range Moderate volume overload    ReDS Actual Value 38

## 2024-04-14 NOTE — Addendum Note (Signed)
 Addended by: Chianna Spirito A on: 04/14/2024 03:57 PM   Modules accepted: Orders

## 2024-04-14 NOTE — Addendum Note (Signed)
 Addended by: Mardell Shade on: 04/14/2024 05:36 PM   Modules accepted: Level of Service

## 2024-04-15 ENCOUNTER — Telehealth: Payer: Self-pay | Admitting: Family

## 2024-04-15 NOTE — Telephone Encounter (Signed)
 Called to confirm/remind patient of their appointment at the [facility] on 04/16/24.   Appointment:   [x] Confirmed  [] Left mess   [] No answer/No voice mail  [] VM Full/unable to leave message  [] Phone not in service  Patient reminded to bring all medications and/or complete list.  Confirmed patient has transportation. Gave directions, instructed to utilize valet parking.

## 2024-04-15 NOTE — Progress Notes (Deleted)
 ADVANCED HEART FAILURE CLINIC NOTE  Referring Physician: Sadie Manna, MD  Primary Care: Sadie Manna, MD HF Provider: Rolan Barrack, MD  Chief Complaint:    HPI: Shane Lamb is a 73 y.o. male with hypertension, persistent atrial fibrillation, chronic prostatitis, and osteoarthritis. Shane Lamb reports that the only cardiac history he is aware of is a diagnosis of atrial fibrillation previously followed at [facility]. Based on chart review, Shane Lamb has been diagnosed with atrial fibrillation/atrial flutter dating back to at least 2000. At that time he underwent AFL ablation at [facility]. In addition, he has undergone PVI/CSI AF ablation in 2012 and a surgical epicardial ablation for atrial fibrillation in 2017. He was previously on rhythm control with Tikosyn  that he self-discontinued and since that time has been on rate control with diltiazem .  He appears to have been in atrial fibrillation since 2017.   Echo in 1/24 showed EF 55-60%, normal RV, mild-moderate MR.   He saw Dr. Cindie in 2/24 for EP evaluation.  Dr. Cindie did not think that there would be utility to repeat ablation due to low risk of maintaining NSR.  He also was interested in trying Tikosyn  again, but Dr. Cindie recommended against this.   Calcium  score scan 3/24 with CAC 261 Agatston units, 55th percentile.   Echo in 8/24 showed EF 55%, normal RV, biatrial enlargement, IVC normal.   Patient was seen by EP at [facility] in 1/25.  He was started back on flecainide with plan for atrial fibrillation ablation on 02/16/24.   Had telemedicine visit with EP 05/25 to discuss rhythm control. Patient had cancelled 04/25 ablation & tikosyn  load.   Was seen in [facility] 2 days ago with HF exacerbation. Had been drinking too much fluids, eating too much salt and not weighing daily. Was given 3 doses of 80mg  furoscix  along with 2.5mg  metolazone  X 2 with extra potassium with each dose of furoscix .   Patient returns for a HF follow-up  visit with a chief complaint of   Labs (1/24): LDL 105, K 5, creatinine 0.87 Labs (4/24): K 4.7, creatinine 107 Labs (8/24): K 4.6, creatinine 0.9, LDL 66, hgb 16.2 Labs (10/24): K 4.8, creatinine 1.1 Labs (3/25): K 4.2, creatinine 0.92, BNP 89.1  ECG (personally reviewed): atrial fibrillation, HR 84   Past Medical History:  Diagnosis Date   Atrial fibrillation (HCC)    Hypertension    Prostatitis, chronic     Current Outpatient Medications  Medication Sig Dispense Refill   acetaminophen  (TYLENOL ) 325 MG tablet Take 650 mg by mouth every 6 (six) hours as needed.     apixaban (ELIQUIS) 5 MG TABS tablet Take 5 mg by mouth 2 (two) times daily.     B Complex-C (B-COMPLEX WITH VITAMIN C) tablet Take 1 tablet by mouth daily.     buPROPion (WELLBUTRIN XL) 150 MG 24 hr tablet Take 150 mg by mouth daily.     Cholecalciferol 50 MCG (2000 UT) CAPS Take by mouth.     cyclobenzaprine (FLEXERIL) 10 MG tablet Take 1 tablet by mouth 3 (three) times daily as needed.     diltiazem  (CARDIZEM  CD) 240 MG 24 hr capsule Take 1 capsule (240 mg total) by mouth daily. 90 capsule 3   empagliflozin  (JARDIANCE ) 10 MG TABS tablet Take 1 tablet (10 mg total) by mouth daily before breakfast. 30 tablet 5   flecainide (TAMBOCOR) 50 MG tablet Take 50 mg by mouth 2 (two) times daily.     furosemide  (LASIX ) 20  MG tablet Take 1 tablet (20 mg total) by mouth daily. 90 tablet 3   HYDROcodone-acetaminophen  (NORCO) 10-325 MG tablet Take 1 tablet by mouth 2 (two) times daily as needed for severe pain.     lisinopril  (ZESTRIL ) 5 MG tablet Take 1 tablet (5 mg total) by mouth daily. 90 tablet 3   lithium  300 MG tablet Take 300 mg by mouth 3 (three) times daily.     Magnesium  200 MG TABS Take 2 tablets by mouth daily.     methocarbamol  (ROBAXIN ) 500 MG tablet Take 500 mg by mouth every 8 (eight) hours as needed.     metolazone  (ZAROXOLYN ) 2.5 MG tablet Take 1 tablet (2.5 mg total) by mouth daily for 2 days. 2 tablet 0    metoprolol  succinate (TOPROL  XL) 50 MG 24 hr tablet Take 1 tablet (50 mg total) by mouth daily. 90 tablet 3   Omega-3 1000 MG CAPS Take by mouth.     oxybutynin (DITROPAN) 5 MG tablet Take 5 mg by mouth 2 (two) times daily.     potassium chloride  (KLOR-CON ) 10 MEQ tablet Take 1 tablet (10 mEq total) by mouth daily. 90 tablet 3   potassium chloride  SA (KLOR-CON  M) 20 MEQ tablet Take 3 tablets (60 mEq total) by mouth daily for 3 days. Take it with your furoscix  3 tablet 0   rosuvastatin  (CRESTOR ) 20 MG tablet Take 1 tablet (20 mg total) by mouth daily. 90 tablet 3   sertraline  (ZOLOFT ) 100 MG tablet Take 100 mg by mouth daily.     sildenafil (VIAGRA) 100 MG tablet Take 100 mg by mouth daily as needed for erectile dysfunction.     tamsulosin (FLOMAX) 0.4 MG CAPS capsule Take 0.4 mg by mouth daily.     tirzepatide  (MOUNJARO ) 2.5 MG/0.5ML Pen Inject 2.5 mg into the skin once a week. 2 mL 3   tiZANidine (ZANAFLEX) 2 MG tablet Take by mouth.     traZODone  (DESYREL ) 50 MG tablet Take 50 mg by mouth at bedtime as needed.     Vibegron  (GEMTESA ) 75 MG TABS Take 1 tablet (75 mg total) by mouth daily. 30 tablet 11   No current facility-administered medications for this visit.    Allergies  Allergen Reactions   Amiodarone Nausea And Vomiting      Social History   Socioeconomic History   Marital status: Married    Spouse name: Not on file   Number of children: Not on file   Years of education: Not on file   Highest education level: Not on file  Occupational History   Occupation: retired  Tobacco Use   Smoking status: Former    Current packs/day: 0.00    Types: Cigarettes    Quit date: 1975    Years since quitting: 50.4   Smokeless tobacco: Former    Quit date: 1974  Substance and Sexual Activity   Alcohol  use: Yes   Drug use: No   Sexual activity: Yes  Other Topics Concern   Not on file  Social History Narrative   Not on file   Social Drivers of Health   Financial Resource  Strain: Low Risk  (03/23/2024)   Received from [facility]   Overall Financial Resource Strain (CARDIA)    Difficulty of Paying Living Expenses: Not very hard  Food Insecurity: Food Insecurity Present (03/23/2024)   [facility]   Hunger Vital Sign    Worried About Running Out of Food in the Last  Year: Sometimes true    Ran Out of Food in the Last Year: Patient declined  Transportation Needs: No Transportation Needs (03/23/2024)   [facility] - [facility]    Lack of Transportation (Medical): No    Lack of Transportation (Non-Medical): No  Physical Activity: Not on file  Stress: Not on file  Social Connections: Not on file  Intimate Partner Violence: Not on file      Family History  Problem Relation Age of Onset   Hypertension Father    Depression Father      ReDs reading: 38 %, abnormal    PHYSICAL EXAM:  General: Well appearing. No resp difficulty HEENT: normal Neck: supple, elevated JVD Cor: Irregular rhythm, rate. No rubs, gallops or murmurs Lungs: clear Abdomen: soft, nontender, distended. Extremities: no cyanosis, clubbing, rash, 3+ pitting edema right lower leg up to knee, 2+ pitting edema left lower leg to knee Neuro: alert & oriented X 3. Moves all 4 extremities w/o difficulty. Affect pleasant   DATA REVIEW  ECHO: 06/25/21 ([facility]): LVEF >55%, mild LVH 06/05/20 ([facility]): LVEF >55%, normal RV function. 1/24:  Echo in 1/24 showed EF 55-60%, normal RV, mild-moderate MR. 8/24: Echo showed EF 55%, normal RV, biatrial enlargement, IVC normal.  Myocardial perfusion / Lexiscan ([facility]): (06/05/2020) SPECT images demonstrate homogeneous tracer distribution throughout the myocardium. Defect type:  Normal   ABIs normal in 2/22   ASSESSMENT & PLAN:  1. Chronic diastolic CHF:  Echo in 8/24 showed EF 55%, normal RV, biatrial enlargement, IVC normal.   - moderately volume up with weight gain and worsening edema - ReDs  slightly elevated at 38% - weight 253 pounds from last visit here 2 days ago - use 80mg  furoscix  for the next 3 days; with each dose of furoscix , take 60meq extra potassium; do not take oral diuretic while using furoscix  - took 2 days of metolazone  2.5mg  daily - once euvolemic plan to change furosemide  to 40mg  torsemide  daily - Continue Jardiance  10 mg daily.  - discussed decreasing fluid intake and sodium intake (has been eating mac/ cheese and [employer]) - BMET/ BNP today - high risk for admission - compression socks daily 2. Atrial fibrillation: As per chart review, hx of atrial flutter ablation in 10/1999 & atrial fibrillation ablation (PVI & CSI) in 2012.  Surgical epicardial ablation in 06/2016.  Previously on Tikosyn  but stopped in the past. Persistent AF since at least 2019 based on ECGs in our system.  Coarse AF again today on ECG. Saw Dr. Cindie with EP, did not think that repeat ablation would be successful. Patient also asked about trying Tikosyn  again.  I spoke with Dr. Cindie about this as well and he did not think this was likely to be successful given the amount of time he has been in atrial fibrillation. However, his atria are not markedly dilated by echo.  He failed flecainide and amiodarone due to adverse side effects (severe nausea with amiodarone). Has been seen by EP at [facility] and was started back on flecainide 50 mg bid and planned for redo ablation on 02/16/24 which patient declined. Had spoken with EP last month (05/25) and now plans for ablation 07/25 - Continue flecainide 50mg  BID - Continue apixaban 5mg  BID.  - Continue Toprol  XL 50 mg daily and diltiazem  CD 240 mg daily.    - EKG today  3. HTN: BP controlled on current regimen.   4. CAD:  Calcium  score 261 Agatston units in 3/24.  No chest pain.  - Continue Crestor  20mg  daily (recently increased in March) - LDL 02/09/24 was 116; recheck in a couple of months; reviewed less fatty foods - No ASA given apixaban use.  5. OSA:  Continue CPAP.  6. Obesity: BMI 33.38 kg/ m2    Shane Lamb Class 04/15/2024  Patient seen and examined with the above-signed Advanced Practice Provider and/or Housestaff. I personally reviewed laboratory data, imaging studies and relevant notes. I independently examined the patient and formulated the important aspects of the plan. I have edited the note to reflect any of my changes or salient points. I have personally discussed the plan with the patient and/or family.  73 y/o male with diastolic HF, PAF, OSA seen today with > 20 pound volume overload in setting of dietary indiscretion. R>L HF symptoms ReDS 38% Poor response to increasing po diuretics.   General:  Obese male sitting up in clinic  No resp difficulty HEENT: normal Neck: supple. JVP to ear Carotids 2+ bilat; no bruits. No lymphadenopathy or thryomegaly appreciated. Cor: PMI nondisplaced. Regular rate & rhythm. No rubs, gallops or murmurs. Lungs: clear Abdomen: obese soft, nontender, nondistended. No hepatosplenomegaly. No bruits or masses. Good bowel sounds. Extremities: no cyanosis, clubbing, rash, 2-3+ edema R>L  Neuro: alert & orientedx3, cranial nerves grossly intact. moves all 4 extremities w/o difficulty. Affect pleasant  He has marked volume overload but respiratory status stable.   Will use Furoscix  80 and metolazone  2.5 daily x 2 days. See back Friday. If no response will need admission.   I spent a total of 45 minutes today: 1) reviewing the patient's medical records including previous charts, labs and recent notes from other providers; 2) examining the patient and counseling them on their medical issues/explaining the plan of care; 3) adjusting meds as needed and 4) ordering lab work or other needed tests.   Shane Lamb Class, FNP  6:57 PM

## 2024-04-15 NOTE — Progress Notes (Signed)
 Called pt back and notified him that he can dispose of the Fursocix once his 5 hours of wearing if is done. Pt had good understanding and no further questions. Pt also stated he can tell ity is working and he is starting to feel better and the swelling in his leg is going down.

## 2024-04-16 ENCOUNTER — Encounter: Payer: Medicare (Managed Care) | Admitting: Family

## 2024-04-16 ENCOUNTER — Telehealth: Payer: Self-pay | Admitting: Family

## 2024-04-16 ENCOUNTER — Telehealth: Payer: Self-pay

## 2024-04-16 NOTE — Telephone Encounter (Signed)
 Called and spoke to pt about canceling and rescheduling his appointment. Offered him a cancellation time today and he refused. Offered him another appointment for the afternoon and he refused again. He states that he has had a pain pill and doesn't want to come out. He states that he feels "65% better". Offered him an appointment for Monday and he was agreeable. No further questions at this time.

## 2024-04-16 NOTE — Telephone Encounter (Signed)
 Called to confirm/remind patient of their appointment at the [facility] on 04/19/24.   Appointment:   [x] Confirmed  [] Left mess   [] No answer/No voice mail  [] VM Full/unable to leave message  [] Phone not in service  Patient reminded to bring all medications and/or complete list.  Confirmed patient has transportation. Gave directions, instructed to utilize valet parking.

## 2024-04-19 ENCOUNTER — Other Ambulatory Visit
Admission: RE | Admit: 2024-04-19 | Discharge: 2024-04-19 | Disposition: A | Discharge status: Home / Self Care | Payer: Medicare (Managed Care) | Source: Ambulatory Visit | Attending: Family | Admitting: Family

## 2024-04-19 ENCOUNTER — Ambulatory Visit (HOSPITAL_BASED_OUTPATIENT_CLINIC_OR_DEPARTMENT_OTHER): Payer: Medicare (Managed Care) | Admitting: Family

## 2024-04-19 ENCOUNTER — Encounter: Payer: Self-pay | Admitting: Family

## 2024-04-19 VITALS — BP 118/78 | HR 88 | Wt 237.4 lb

## 2024-04-19 DIAGNOSIS — I480 Paroxysmal atrial fibrillation: Secondary | ICD-10-CM

## 2024-04-19 DIAGNOSIS — I2510 Atherosclerotic heart disease of native coronary artery without angina pectoris: Secondary | ICD-10-CM

## 2024-04-19 DIAGNOSIS — I5032 Chronic diastolic (congestive) heart failure: Secondary | ICD-10-CM | POA: Diagnosis not present

## 2024-04-19 DIAGNOSIS — E669 Obesity, unspecified: Secondary | ICD-10-CM

## 2024-04-19 DIAGNOSIS — G4733 Obstructive sleep apnea (adult) (pediatric): Secondary | ICD-10-CM

## 2024-04-19 DIAGNOSIS — I10 Essential (primary) hypertension: Secondary | ICD-10-CM

## 2024-04-19 LAB — BASIC METABOLIC PANEL WITH GFR
Anion gap: 11 (ref 5–15)
BUN: 27 mg/dL — ABNORMAL HIGH (ref 8–23)
CO2: 26 mmol/L (ref 22–32)
Calcium: 9.4 mg/dL (ref 8.9–10.3)
Chloride: 99 mmol/L (ref 98–111)
Creatinine, Ser: 1.09 mg/dL (ref 0.61–1.24)
GFR, Estimated: >60 mL/min (ref >60)
Glucose, Bld: 113 mg/dL — ABNORMAL HIGH (ref 70–99)
Potassium: 3.2 mmol/L — ABNORMAL LOW (ref 3.5–5.1)
Sodium: 136 mmol/L (ref 135–145)

## 2024-04-19 LAB — BRAIN NATRIURETIC PEPTIDE: B Natriuretic Peptide: 63.5 pg/mL (ref 0.0–100.0)

## 2024-04-19 NOTE — Patient Instructions (Signed)
 Great to see you today!!!  Medication Changes:  None, continue current medications  Lab Work:  Labs are needed today, please: Go over to the [facility]. Go pass the gift shop and have your blood work completed.  We will only call you if the results are abnormal or if the provider would like to make medication changes.  Special Instructions // Education:  Do the following things EVERYDAY: Weigh yourself in the morning before breakfast. Write it down and keep it in a log. Take your medicines as prescribed Eat low salt foods--Limit salt (sodium) to 2000 mg per day.  Stay as active as you can everyday Limit all fluids for the day to less than 2 liters   Follow-Up in: 1 month    If you have any questions or concerns before your next appointment please send us  a message through [facility] or call our office at [phone], If it is after office hours your call will be answered by our answering service and directed appropriately.     At the [facility], you and your health needs are our priority. We have a designated team specialized in the treatment of Heart Failure. This Care Team includes your primary Heart Failure Specialized Cardiologist (physician), Advanced Practice Providers (APPs- Physician Assistants and Nurse Practitioners), and Pharmacist who all work together to provide you with the care you need, when you need it.   You may see any of the following providers on your designated Care Team at your next follow up:  Dr. Jules Oar Dr. Peder Bourdon Dr. Alwin Baars Dr. Judyth Nunnery Nieves Bars, NP Ruddy Corral, [place] [address] [place], [place] Dennise Fitz, NP [place] Lee, NP [place] Dellen, NP Bevely Brush, PharmD

## 2024-04-19 NOTE — Progress Notes (Signed)
 ReDS Vest / Clip - 04/19/24 1145       ReDS Vest / Clip   Station Marker C    Ruler Value 33    ReDS Value Range Low volume    ReDS Actual Value 33

## 2024-04-19 NOTE — Progress Notes (Signed)
 ADVANCED HEART FAILURE CLINIC NOTE   Referring Physician: Antonio Baumgarten, MD  Primary Care: Antonio Baumgarten, MD HF Provider: Peder Bourdon, MD  Chief Complaint: shortness of breath   HPI:  Shane Lamb is a 73 y.o. male with hypertension, persistent atrial fibrillation, chronic prostatitis, and osteoarthritis. Shane Lamb reports that the only cardiac history he is aware of is a diagnosis of atrial fibrillation previously followed at [facility]. Based on chart review, Shane Lamb has been diagnosed with atrial fibrillation/atrial flutter dating back to at least 2000. At that time he underwent AFL ablation at [facility]. In addition, he has undergone PVI/CSI AF ablation in 2012 and a surgical epicardial ablation for atrial fibrillation in 2017. He was previously on rhythm control with Tikosyn  that he self-discontinued and since that time has been on rate control with diltiazem .  He appears to have been in atrial fibrillation since 2017.   Echo in 1/24 showed EF 55-60%, normal RV, mild-moderate MR.   He saw Dr. Marven Slimmer in 2/24 for EP evaluation.  Dr. Marven Slimmer did not think that there would be utility to repeat ablation due to low risk of maintaining NSR.  He also was interested in trying Tikosyn  again, but Dr. Marven Slimmer recommended against this.   Calcium  score scan 3/24 with CAC 261 Agatston units, 55th percentile.   Echo in 8/24 showed EF 55%, normal RV, biatrial enlargement, IVC normal.   Patient was seen by EP at [facility] in 1/25.  He was started back on flecainide with plan for atrial fibrillation ablation on 02/16/24.   Had telemedicine visit with EP 05/25 to discuss rhythm control. Patient had cancelled 04/25 ablation & tikosyn  load.   Was seen in [facility] 5 days ago with HF exacerbation. Had been drinking too much fluids, eating too much salt and not weighing daily. Was given 2 doses of 80mg  furoscix  along with 2.5mg  metolazone  X 2 with extra potassium with each dose of furoscix . He was supposed to  use a 3rd furoscix  dose but says that he never received it in the mail.   Patient returns for a HF follow-up visit with a chief complaint of shortness of breath. He feels like his shortness of breath has improved since last here. He has associated fatigue, pedal edema (improving), dizziness. He denies chest pain, palpitations or abdominal distention.   He is now watching his sodium intake as well as his fluid intake more closely although did eat a sausage biscuit this morning.   Labs (1/24): LDL 105, K 5, creatinine 0.87 Labs (4/24): K 4.7, creatinine 107 Labs (8/24): K 4.6, creatinine 0.9, LDL 66, hgb 16.2 Labs (10/24): K 4.8, creatinine 1.1 Labs (3/25): K 4.2, creatinine 0.92, BNP 89.1  ECG (personally reviewed): atrial fibrillation, HR 84   Past Medical History:  Diagnosis Date   Atrial fibrillation (HCC)    Hypertension    Prostatitis, chronic     Current Outpatient Medications  Medication Sig Dispense Refill   acetaminophen  (TYLENOL ) 325 MG tablet Take 650 mg by mouth every 6 (six) hours as needed.     apixaban (ELIQUIS) 5 MG TABS tablet Take 5 mg by mouth 2 (two) times daily.     B Complex-C (B-COMPLEX WITH VITAMIN C) tablet Take 1 tablet by mouth daily.     buPROPion (WELLBUTRIN XL) 150 MG 24 hr tablet Take 150 mg by mouth daily.     Cholecalciferol 50 MCG (2000 UT) CAPS Take by mouth.     cyclobenzaprine (FLEXERIL) 10 MG tablet Take  1 tablet by mouth 3 (three) times daily as needed.     diltiazem  (CARDIZEM  CD) 240 MG 24 hr capsule Take 1 capsule (240 mg total) by mouth daily. 90 capsule 3   empagliflozin  (JARDIANCE ) 10 MG TABS tablet Take 1 tablet (10 mg total) by mouth daily before breakfast. 30 tablet 5   flecainide (TAMBOCOR) 50 MG tablet Take 50 mg by mouth 2 (two) times daily.     furosemide  (LASIX ) 20 MG tablet Take 1 tablet (20 mg total) by mouth daily. 90 tablet 3   HYDROcodone-acetaminophen  (NORCO) 10-325 MG tablet Take 1 tablet by mouth 2 (two) times daily as  needed for severe pain.     Magnesium  200 MG TABS Take 2 tablets by mouth daily.     methocarbamol  (ROBAXIN ) 500 MG tablet Take 500 mg by mouth every 8 (eight) hours as needed.     metoprolol  succinate (TOPROL  XL) 50 MG 24 hr tablet Take 1 tablet (50 mg total) by mouth daily. 90 tablet 3   Omega-3 1000 MG CAPS Take by mouth.     oxybutynin (DITROPAN) 5 MG tablet Take 5 mg by mouth 2 (two) times daily.     potassium chloride  (KLOR-CON ) 10 MEQ tablet Take 1 tablet (10 mEq total) by mouth daily. 90 tablet 3   rosuvastatin  (CRESTOR ) 20 MG tablet Take 1 tablet (20 mg total) by mouth daily. 90 tablet 3   sertraline  (ZOLOFT ) 100 MG tablet Take 100 mg by mouth daily.     sildenafil (VIAGRA) 100 MG tablet Take 100 mg by mouth daily as needed for erectile dysfunction.     tamsulosin (FLOMAX) 0.4 MG CAPS capsule Take 0.4 mg by mouth daily.     tiZANidine (ZANAFLEX) 2 MG tablet Take by mouth.     traZODone  (DESYREL ) 50 MG tablet Take 100 mg by mouth at bedtime as needed for sleep.     lisinopril  (ZESTRIL ) 5 MG tablet Take 1 tablet (5 mg total) by mouth daily. (Patient not taking: Reported on 04/19/2024) 90 tablet 3   metolazone  (ZAROXOLYN ) 2.5 MG tablet Take 1 tablet (2.5 mg total) by mouth daily for 2 days. (Patient not taking: Reported on 04/19/2024) 2 tablet 0   tirzepatide  (MOUNJARO ) 2.5 MG/0.5ML Pen Inject 2.5 mg into the skin once a week. (Patient not taking: Reported on 04/19/2024) 2 mL 3   Vibegron  (GEMTESA ) 75 MG TABS Take 1 tablet (75 mg total) by mouth daily. (Patient not taking: Reported on 04/19/2024) 30 tablet 11   No current facility-administered medications for this visit.    Allergies  Allergen Reactions   Amiodarone Nausea And Vomiting      Social History   Socioeconomic History   Marital status: Married    Spouse name: Not on file   Number of children: Not on file   Years of education: Not on file   Highest education level: Not on file  Occupational History   Occupation: retired   Tobacco Use   Smoking status: Former    Current packs/day: 0.00    Types: Cigarettes    Quit date: 1975    Years since quitting: 50.4   Smokeless tobacco: Former    Quit date: 1974  Substance and Sexual Activity   Alcohol  use: Yes   Drug use: No   Sexual activity: Yes  Other Topics Concern   Not on file  Social History Narrative   Not on file   Social Drivers of Health   Financial Resource Strain: Low Risk  (  03/23/2024)   Received from [facility]   Overall Financial Resource Strain (CARDIA)    Difficulty of Paying Living Expenses: Not very hard  Food Insecurity: Food Insecurity Present (03/23/2024)   Received from [facility]   Hunger Vital Sign    Worried About Running Out of Food in the Last Year: Sometimes true    Ran Out of Food in the Last Year: Patient declined  Transportation Needs: No Transportation Needs (03/23/2024)   Received from [facility] - Transportation    Lack of Transportation (Medical): No    Lack of Transportation (Non-Medical): No  Physical Activity: Not on file  Stress: Not on file  Social Connections: Not on file  Intimate Partner Violence: Not on file      Family History  Problem Relation Age of Onset   Hypertension Father    Depression Father     ReDs reading: 33 %, normal   PHYSICAL EXAM:  General: Well appearing. No resp difficulty HEENT: normal Neck: supple, JVD elevated but less so  Cor: Irregular rhythm, rate. No rubs, gallops or murmurs Lungs: clear Abdomen: soft, nontender, distended although softer. Extremities: no cyanosis, clubbing, rash, 2+ pitting edema right lower leg, 1+ pitting left lower leg Neuro: alert & oriented X 3. Moves all 4 extremities w/o difficulty. Affect pleasant   DATA REVIEW  ECHO: 06/25/21 ([facility]): LVEF >55%, mild LVH 06/05/20 ([facility]): LVEF >55%, normal RV function. 1/24:  Echo in 1/24 showed EF 55-60%, normal RV, mild-moderate MR. 8/24: Echo showed EF 55%, normal RV, biatrial  enlargement, IVC normal.  Myocardial perfusion / Lexiscan ([facility]): (06/05/2020) SPECT images demonstrate homogeneous tracer distribution throughout the myocardium. Defect type:  Normal   ABIs normal in 2/22   ASSESSMENT & PLAN:  1. Chronic diastolic CHF:  Echo in 8/24 showed EF 55%, normal RV, biatrial enlargement, IVC normal.   - remains fluid up but improving - ReDs normal at 33% although fluid retention in abdomen/ legs - weight down 16 pounds from last visit here 5 days ago - used 2 doses of furoscix  as he didn't get the 3rd one in the mail. 2 more doses given today - took 2 days of metolazone  2.5mg  daily - continue furosemide  with plan to change to 40mg  torsemide daily - Continue Jardiance  10 mg daily.  - encouraged to continue to closely follow sodium / fluid intake - BMET/ BNP today & may change to torsemide after results obtained - compression socks daily 2. Atrial fibrillation: As per chart review, hx of atrial flutter ablation in 10/1999 & atrial fibrillation ablation (PVI & CSI) in 2012.  Surgical epicardial ablation in 06/2016.  Previously on Tikosyn  but stopped in the past. Persistent AF since at least 2019 based on ECGs in our system. Saw Dr. Marven Slimmer with EP, did not think that repeat ablation would be successful. Patient also asked about trying Tikosyn  again.  I spoke with Dr. Marven Slimmer about this as well and he did not think this was likely to be successful given the amount of time he has been in atrial fibrillation. However, his atria are not markedly dilated by echo.  He failed flecainide and amiodarone due to adverse side effects (severe nausea with amiodarone). Has been seen by EP at [facility] and was started back on flecainide 50 mg bid and planned for redo ablation on 02/16/24 which patient declined. Had spoken with EP last month (05/25) and now plans for ablation 07/25 - Continue flecainide 50mg  BID -  Continue apixaban 5mg  BID.  - Continue Toprol  XL 50 mg daily and diltiazem  CD 240  mg daily.    - EKG today  3. HTN: BP controlled on current regimen.   4. CAD:  Calcium  score 261 Agatston units in 3/24.  No chest pain.  - Continue Crestor  20mg  daily (recently increased in March) - LDL 02/09/24 was 116; recheck in a couple of months; reviewed less fatty foods - No ASA given apixaban use.  5. OSA: Continue CPAP.  6. Obesity: BMI 31.32 kg/ m2   Return in 1 month with HF MD, sooner if needed.   Charlette Console 04/19/2024

## 2024-04-20 ENCOUNTER — Other Ambulatory Visit: Payer: Self-pay | Admitting: Family

## 2024-04-20 ENCOUNTER — Ambulatory Visit: Payer: Self-pay | Admitting: Family

## 2024-04-20 DIAGNOSIS — I5032 Chronic diastolic (congestive) heart failure: Secondary | ICD-10-CM

## 2024-04-20 MED ORDER — POTASSIUM CHLORIDE ER 10 MEQ PO TBCR: 20.0000 meq | EXTENDED_RELEASE_TABLET | Freq: Two times a day (BID) | ORAL | 3 refills | Status: AC

## 2024-04-20 MED ORDER — TORSEMIDE 20 MG PO TABS: 40.0000 mg | ORAL_TABLET | Freq: Every day | ORAL | 3 refills | Status: DC

## 2024-04-23 MED ORDER — POTASSIUM CHLORIDE CRYS ER 20 MEQ PO TBCR: 20.0000 meq | EXTENDED_RELEASE_TABLET | Freq: Two times a day (BID) | ORAL | 5 refills | Status: DC

## 2024-04-28 ENCOUNTER — Telehealth: Payer: Self-pay | Admitting: Pharmacist

## 2024-04-28 NOTE — Telephone Encounter (Signed)
 Spoke with Shane Lamb to evaluate ability to titrate Mounjaro  to 5 mg weekly. Patient has not started taking Mounjaro  yet. Reports he will start this week. Advise reaching out to EP team to determine hold time prior to ablation.

## 2024-04-29 ENCOUNTER — Encounter: Payer: Self-pay | Admitting: Cardiology

## 2024-04-29 ENCOUNTER — Telehealth: Payer: Self-pay

## 2024-04-29 NOTE — Telephone Encounter (Signed)
   I have sent a message for our nurse to call Mr Heinkel about his diuretics regimen.   Emalea Mix NP-C  12:32 PM

## 2024-04-29 NOTE — Telephone Encounter (Signed)
 Spoke to pt to clarify medications. Pt stated that he is taking Furosemide  40mg  with 40meq of Potassium daily. Pt states that those meds with a low salt diet has controlled his leg swelling. Pt prefer to continue that regimen. Updated medication list.

## 2024-05-13 ENCOUNTER — Telehealth: Payer: Self-pay | Admitting: Family

## 2024-05-13 NOTE — Telephone Encounter (Signed)
 Called to confirm/remind patient of their appointment at the [facility] on 05/17/24.   Appointment:   [] Confirmed  [x] Left mess   [] No answer/No voice mail  [] VM Full/unable to leave message  [] Phone not in service  Patient reminded to bring all medications and/or complete list.  Confirmed patient has transportation. Gave directions, instructed to utilize valet parking.

## 2024-05-17 ENCOUNTER — Encounter: Payer: Medicare (Managed Care) | Admitting: Cardiology

## 2024-05-20 ENCOUNTER — Encounter: Payer: Medicare (Managed Care) | Admitting: Cardiology

## 2024-05-26 ENCOUNTER — Telehealth: Payer: Self-pay | Admitting: Pharmacist

## 2024-05-26 MED ORDER — MOUNJARO 5 MG/0.5ML ~~LOC~~ SOAJ: 5.0000 mg | SUBCUTANEOUS | 0 refills | Status: AC

## 2024-05-26 NOTE — Telephone Encounter (Signed)
 Called patient to assess GLP-1RA therapy. Patient holding this week for upcoming ablation. Patient will resume after hospital discharge.

## 2024-05-31 ENCOUNTER — Encounter: Payer: Medicare (Managed Care) | Admitting: Cardiology

## 2024-06-15 ENCOUNTER — Telehealth: Payer: Self-pay | Admitting: Family

## 2024-06-15 NOTE — Progress Notes (Signed)
 Pt supposed to be transitioning to Torsemide . Pt was due for follow-up in July. Has cancelled 3 appts. Haskel called pt and advised pt to reschedule due to possible med changes. Appt scheduled for tomorrow 06/16/2024 with Ellouise.

## 2024-06-15 NOTE — Progress Notes (Deleted)
 ADVANCED HEART FAILURE CLINIC NOTE   Referring Physician: Sadie Manna, MD  Primary Care: Sadie Manna, MD HF Provider: Rolan Barrack, MD  Chief Complaint:    HPI:  Shane Lamb is a 73 y.o. male with hypertension, persistent atrial fibrillation, chronic prostatitis, and osteoarthritis. Mr. Chow reports that the only cardiac history he is aware of is a diagnosis of atrial fibrillation previously followed at [facility]. Based on chart review, Mr. Stanbery has been diagnosed with atrial fibrillation/atrial flutter dating back to at least 2000. At that time he underwent AFL ablation at [facility]. In addition, he has undergone PVI/CSI AF ablation in 2012 and a surgical epicardial ablation for atrial fibrillation in 2017. He was previously on rhythm control with Tikosyn  that he self-discontinued and since that time has been on rate control with diltiazem .  He appears to have been in atrial fibrillation since 2017.   Echo in 1/24 showed EF 55-60%, normal RV, mild-moderate MR.   He saw Dr. Cindie in 2/24 for EP evaluation.  Dr. Cindie did not think that there would be utility to repeat ablation due to low risk of maintaining NSR.  He also was interested in trying Tikosyn  again, but Dr. Cindie recommended against this.   Calcium  score scan 3/24 with CAC 261 Agatston units, 55th percentile.   Echo in 8/24 showed EF 55%, normal RV, biatrial enlargement, IVC normal.   Patient was seen by EP at [facility] in 1/25.  He was started back on flecainide with plan for atrial fibrillation ablation on 02/16/24.   Had telemedicine visit with EP 05/25 to discuss rhythm control. Patient had cancelled 04/25 ablation & tikosyn  load.   Was seen in [facility] 06/25 ago with HF exacerbation. Had been drinking too much fluids, eating too much salt and not weighing daily. Was given 2 doses of 80mg  furoscix  along with 2.5mg  metolazone  X 2 with extra potassium with each dose of furoscix . He was supposed to use a 3rd furoscix   dose but says that he never received it in the mail.   Patient returns for a HF follow-up visit with a chief complaint of    Labs (1/24): LDL 105, K 5, creatinine 0.87 Labs (4/24): K 4.7, creatinine 107 Labs (8/24): K 4.6, creatinine 0.9, LDL 66, hgb 16.2 Labs (10/24): K 4.8, creatinine 1.1 Labs (3/25): K 4.2, creatinine 0.92, BNP 89.1 Labs (6/25): K 3.2, creatinine 1.09  ECG    Past Medical History:  Diagnosis Date   Atrial fibrillation (HCC)    Hypertension    Prostatitis, chronic     Current Outpatient Medications  Medication Sig Dispense Refill   acetaminophen  (TYLENOL ) 325 MG tablet Take 650 mg by mouth every 6 (six) hours as needed.     apixaban (ELIQUIS) 5 MG TABS tablet Take 5 mg by mouth 2 (two) times daily.     B Complex-C (B-COMPLEX WITH VITAMIN C) tablet Take 1 tablet by mouth daily.     buPROPion (WELLBUTRIN XL) 150 MG 24 hr tablet Take 150 mg by mouth daily.     Cholecalciferol 50 MCG (2000 UT) CAPS Take by mouth.     cyclobenzaprine (FLEXERIL) 10 MG tablet Take 1 tablet by mouth 3 (three) times daily as needed.     diltiazem  (CARDIZEM  CD) 240 MG 24 hr capsule Take 1 capsule (240 mg total) by mouth daily. 90 capsule 3   empagliflozin  (JARDIANCE ) 10 MG TABS tablet Take 1 tablet (10 mg total) by mouth daily before breakfast. 30 tablet 5  flecainide (TAMBOCOR) 50 MG tablet Take 50 mg by mouth 2 (two) times daily.     furosemide  (LASIX ) 40 MG tablet Take 40 mg by mouth daily.     HYDROcodone-acetaminophen  (NORCO) 10-325 MG tablet Take 1 tablet by mouth 2 (two) times daily as needed for severe pain.     lisinopril  (ZESTRIL ) 5 MG tablet Take 1 tablet (5 mg total) by mouth daily. (Patient not taking: Reported on 04/19/2024) 90 tablet 3   Magnesium  200 MG TABS Take 2 tablets by mouth daily.     methocarbamol  (ROBAXIN ) 500 MG tablet Take 500 mg by mouth every 8 (eight) hours as needed.     metoprolol  succinate (TOPROL  XL) 50 MG 24 hr tablet Take 1 tablet (50 mg total) by  mouth daily. 90 tablet 3   Omega-3 1000 MG CAPS Take by mouth.     oxybutynin (DITROPAN) 5 MG tablet Take 5 mg by mouth 2 (two) times daily.     potassium chloride  (KLOR-CON ) 10 MEQ tablet Take 2 tablets (20 mEq total) by mouth 2 (two) times daily. 360 tablet 3   potassium chloride  SA (KLOR-CON  M) 20 MEQ tablet Take 1 tablet (20 mEq total) by mouth 2 (two) times daily. 60 tablet 5   rosuvastatin  (CRESTOR ) 20 MG tablet Take 1 tablet (20 mg total) by mouth daily. 90 tablet 3   sertraline  (ZOLOFT ) 100 MG tablet Take 100 mg by mouth daily.     sildenafil (VIAGRA) 100 MG tablet Take 100 mg by mouth daily as needed for erectile dysfunction.     tamsulosin (FLOMAX) 0.4 MG CAPS capsule Take 0.4 mg by mouth daily.     tirzepatide  (MOUNJARO ) 5 MG/0.5ML Pen Inject 5 mg into the skin once a week. 2 mL 0   tiZANidine (ZANAFLEX) 2 MG tablet Take by mouth.     traZODone  (DESYREL ) 50 MG tablet Take 100 mg by mouth at bedtime as needed for sleep.     Vibegron  (GEMTESA ) 75 MG TABS Take 1 tablet (75 mg total) by mouth daily. (Patient not taking: Reported on 04/19/2024) 30 tablet 11   No current facility-administered medications for this visit.    Allergies  Allergen Reactions   Amiodarone Nausea And Vomiting      Social History   Socioeconomic History   Marital status: Married    Spouse name: Not on file   Number of children: Not on file   Years of education: Not on file   Highest education level: Not on file  Occupational History   Occupation: retired  Tobacco Use   Smoking status: Former    Current packs/day: 0.00    Types: Cigarettes    Quit date: 1975    Years since quitting: 50.6   Smokeless tobacco: Former    Quit date: 1974  Substance and Sexual Activity   Alcohol  use: Yes   Drug use: No   Sexual activity: Yes  Other Topics Concern   Not on file  Social History Narrative   Not on file   Social Drivers of Health   Financial Resource Strain: Low Risk  (04/27/2024)   Received from  [facility]   Overall Financial Resource Strain (CARDIA)    Difficulty of Paying Living Expenses: Not hard at all  Food Insecurity: No Food Insecurity (04/27/2024)   Received from [facility]   Hunger Vital Sign    Within the past 12 months, you worried that your food would run out before you got  the money to buy more.: Never true    Within the past 12 months, the food you bought just didn't last and you didn't have money to get more.: Never true  Recent Concern: Food Insecurity - Food Insecurity Present (03/23/2024)   Received from [facility]   Hunger Vital Sign    Within the past 12 months, you worried that your food would run out before you got the money to buy more.: Sometimes true    Within the past 12 months, the food you bought just didn't last and you didn't have money to get more.: Patient declined  Transportation Needs: No Transportation Needs (04/27/2024)   Received from [facility] - Transportation    In the past 12 months, has lack of transportation kept you from medical appointments or from getting medications?: No    Lack of Transportation (Non-Medical): No  Physical Activity: Not on file  Stress: Not on file  Social Connections: Not on file  Intimate Partner Violence: Not on file      Family History  Problem Relation Age of Onset   Hypertension Father    Depression Father        PHYSICAL EXAM:  General: Well appearing. No resp difficulty HEENT: normal Neck: supple, JVD elevated but less so  Cor: Irregular rhythm, rate. No rubs, gallops or murmurs Lungs: clear Abdomen: soft, nontender, distended although softer. Extremities: no cyanosis, clubbing, rash, 2+ pitting edema right lower leg, 1+ pitting left lower leg Neuro: alert & oriented X 3. Moves all 4 extremities w/o difficulty. Affect pleasant   DATA REVIEW  ECHO: 06/25/21 ([facility]): LVEF >55%, mild LVH 06/05/20 ([facility]): LVEF >55%, normal RV  function. 1/24:  Echo in 1/24 showed EF 55-60%, normal RV, mild-moderate MR. 8/24: Echo showed EF 55%, normal RV, biatrial enlargement, IVC normal.  Myocardial perfusion / Lexiscan ([facility]): (06/05/2020) SPECT images demonstrate homogeneous tracer distribution throughout the myocardium. Defect type:  Normal   ABIs normal in 2/22   ASSESSMENT & PLAN:  1. Chronic diastolic CHF:  Echo in 8/24 showed EF 55%, normal RV, biatrial enlargement, IVC normal.   - remains fluid up but improving - weight 237,6 pounds from last visit here 2 months ago - continue furosemide  with plan to change to 40mg  torsemide  daily - Continue Jardiance  10 mg daily.  - encouraged to continue to closely follow sodium / fluid intake - compression socks daily  2. Atrial fibrillation: As per chart review, hx of atrial flutter ablation in 10/1999 & atrial fibrillation ablation (PVI & CSI) in 2012.  Surgical epicardial ablation in 06/2016.  Previously on Tikosyn  but stopped in the past. Persistent AF since at least 2019 based on ECGs in our system. Saw Dr. Cindie with EP, did not think that repeat ablation would be successful. Patient also asked about trying Tikosyn  again.  I spoke with Dr. Cindie about this as well and he did not think this was likely to be successful given the amount of time he has been in atrial fibrillation. However, his atria are not markedly dilated by echo.  He failed flecainide and amiodarone due to adverse side effects (severe nausea with amiodarone). Has been seen by EP at [facility] and was started back on flecainide 50 mg bid and planned for redo ablation on 02/16/24 which patient declined. Had spoken with EP 5/25. Had ablation 07/25 - Continue flecainide 50mg  BID - Continue apixaban 5mg  BID.  - Continue Toprol  XL 50 mg daily and diltiazem   CD 240 mg daily.     3. HTN:  - BP - BMET 04/19/24 reviewed: sodium 136, potassium 3.2, creatinine 1.09 & GFR >60   4. CAD:  Calcium  score 261 Agatston units in 3/24.  No  chest pain.  - Continue Crestor  20mg  daily (increased in March) - LDL 02/09/24 was 116; lipid panel today - No ASA given apixaban use.   5. OSA: Continue CPAP.   6. Obesity: BMI 31.32 kg/ m2   Ellouise DELENA Class 06/15/2024

## 2024-06-15 NOTE — Telephone Encounter (Signed)
 Called to confirm/remind patient of their appointment at the [facility] on 06/16/24.   Appointment:   [x] Confirmed  [] Left mess   [] No answer/No voice mail  [] VM Full/unable to leave message  [] Phone not in service  Patient reminded to bring all medications and/or complete list.  Confirmed patient has transportation. Gave directions, instructed to utilize valet parking.

## 2024-06-16 ENCOUNTER — Encounter: Payer: Medicare (Managed Care) | Admitting: Family

## 2024-06-21 ENCOUNTER — Telehealth: Payer: Self-pay | Admitting: Family

## 2024-06-21 NOTE — Telephone Encounter (Signed)
 Called to confirm/remind patient of their appointment at the [facility] on 06/22/24.   Appointment:   [x] Confirmed  [] Left mess   [] No answer/No voice mail  [] VM Full/unable to leave message  [] Phone not in service  Patient reminded to bring all medications and/or complete list.  Confirmed patient has transportation. Gave directions, instructed to utilize valet parking.

## 2024-06-22 ENCOUNTER — Other Ambulatory Visit
Admission: RE | Admit: 2024-06-22 | Discharge: 2024-06-22 | Disposition: A | Discharge status: Home / Self Care | Payer: Medicare (Managed Care) | Source: Ambulatory Visit | Attending: Family | Admitting: Family

## 2024-06-22 ENCOUNTER — Encounter: Payer: Self-pay | Admitting: Family

## 2024-06-22 ENCOUNTER — Ambulatory Visit (HOSPITAL_BASED_OUTPATIENT_CLINIC_OR_DEPARTMENT_OTHER): Payer: Medicare (Managed Care) | Admitting: Family

## 2024-06-22 VITALS — BP 111/79 | HR 65 | Wt 227.0 lb

## 2024-06-22 DIAGNOSIS — I480 Paroxysmal atrial fibrillation: Secondary | ICD-10-CM | POA: Diagnosis not present

## 2024-06-22 DIAGNOSIS — I2510 Atherosclerotic heart disease of native coronary artery without angina pectoris: Secondary | ICD-10-CM | POA: Insufficient documentation

## 2024-06-22 DIAGNOSIS — E669 Obesity, unspecified: Secondary | ICD-10-CM

## 2024-06-22 DIAGNOSIS — I5032 Chronic diastolic (congestive) heart failure: Secondary | ICD-10-CM | POA: Diagnosis present

## 2024-06-22 DIAGNOSIS — I10 Essential (primary) hypertension: Secondary | ICD-10-CM | POA: Diagnosis not present

## 2024-06-22 DIAGNOSIS — G4733 Obstructive sleep apnea (adult) (pediatric): Secondary | ICD-10-CM

## 2024-06-22 LAB — LIPID PANEL
Cholesterol: 146 mg/dL (ref 0–200)
HDL: 48 mg/dL (ref >40)
LDL Cholesterol: 69 mg/dL (ref 0–99)
Total CHOL/HDL Ratio: 3 ratio
Triglycerides: 144 mg/dL (ref <150)
VLDL: 29 mg/dL (ref 0–40)

## 2024-06-22 LAB — BASIC METABOLIC PANEL WITH GFR
Anion gap: 10 (ref 5–15)
BUN: 12 mg/dL (ref 8–23)
CO2: 26 mmol/L (ref 22–32)
Calcium: 9.4 mg/dL (ref 8.9–10.3)
Chloride: 105 mmol/L (ref 98–111)
Creatinine, Ser: 0.89 mg/dL (ref 0.61–1.24)
GFR, Estimated: >60 mL/min (ref >60)
Glucose, Bld: 117 mg/dL — ABNORMAL HIGH (ref 70–99)
Potassium: 4.8 mmol/L (ref 3.5–5.1)
Sodium: 141 mmol/L (ref 135–145)

## 2024-06-22 NOTE — Progress Notes (Signed)
 [facility] CLIN[facility] NOTE   Referring Physician: Sadie Manna, MD  Primary Care: Sadie Manna, MD HF Provider: Rolan Barrack, MD  Chief Complaint: shortness of breath   HPI:  Shane Lamb is a 73 y.o. male with hypertension, persistent atrial fibrillation, chronic prostatitis, and osteoarthritis. [name] reports that the only cardiac history he is aware of is a diagnosis of atrial fibrillation previously followed at [facility]. Based on chart review, [name] has been diagnosed with atrial fibrillation/atrial flutter dating back to at least 2000. At that time he underwent AFL ablation at [facility]. In addition, he has undergone PVI/CSI AF ablation in 2012 and a surgical epicardial ablation for atrial fibrillation in 2017. He was previously on rhythm control with Tikosyn  that he self-discontinued and since that time has been on rate control with diltiazem .  He appears to have been in atrial fibrillation since 2017.   Echo in 1/24 showed EF 55-60%, normal RV, mild-moderate MR.   He saw Dr. Cindie in 2/24 for EP evaluation.  Dr. Cindie did not think that there would be utility to repeat ablation due to low risk of maintaining NSR.  He also was interested in trying Tikosyn  again, but Dr. Cindie recommended against this.   Calcium  score scan 3/24 with CAC 261 Agatston units, 55th percentile.   Echo in 8/24 showed EF 55%, normal RV, biatrial enlargement, IVC normal.   Patient was seen by EP at [facility] in 1/25.  He was started back on flecainide with plan for atrial fibrillation ablation on 02/16/24.   Had telemedicine visit with EP 05/25 to discuss rhythm control. Patient had cancelled 04/25 ablation & tikosyn  load.   Was seen in [facility] 06/25 ago with HF exacerbation. Had been drinking too much fluids, eating too much salt and not weighing daily. Was given 2 doses of 80mg  furoscix  along with 2.5mg  metolazone  X 2 with extra potassium with each dose of furoscix . He was supposed to  use a 3rd furoscix  dose but says that he never received it in the mail.   Had Watchman procedure / ablation done 05/31/24  Patient returns for a HF follow-up visit with a chief complaint of shortness of breath. Has associated fatigue and some swelling around his ankles. Denies chest pain, palpitations, abdominal distention, dizziness. Overall, he says that he feels great. Says that he tried using some sea salt the other day and he developed more swelling so now realizes that he has to carefully monitor his sodium intake.   Labs (1/24): LDL 105, K 5, creatinine 0.87 Labs (4/24): K 4.7, creatinine 107 Labs (8/24): K 4.6, creatinine 0.9, LDL 66, hgb 16.2 Labs (10/24): K 4.8, creatinine 1.1 Labs (3/25): K 4.2, creatinine 0.92, BNP 89.1 Labs (6/25): K 3.2, creatinine 1.09 Labs (7/25)  K 4.7, creatinine 0.74  ECG not done   Past Medical History:  Diagnosis Date   Atrial fibrillation (HCC)    Hypertension    Prostatitis, chronic     Current Outpatient Medications  Medication Sig Dispense Refill   acetaminophen  (TYLENOL ) 325 MG tablet Take 650 mg by mouth every 6 (six) hours as needed.     apixaban (ELIQUIS) 5 MG TABS tablet Take 5 mg by mouth 2 (two) times daily.     B Complex-C (B-COMPLEX WITH VITAMIN C) tablet Take 1 tablet by mouth daily.     buPROPion (WELLBUTRIN XL) 150 MG 24 hr tablet Take 150 mg by mouth daily.     Cholecalciferol 50 MCG (2000 UT)  CAPS Take by mouth.     cyclobenzaprine (FLEXERIL) 10 MG tablet Take 1 tablet by mouth 3 (three) times daily as needed.     diltiazem  (CARDIZEM  CD) 240 MG 24 hr capsule Take 1 capsule (240 mg total) by mouth daily. 90 capsule 3   empagliflozin  (JARDIANCE ) 10 MG TABS tablet Take 1 tablet (10 mg total) by mouth daily before breakfast. 30 tablet 5   flecainide (TAMBOCOR) 50 MG tablet Take 50 mg by mouth 2 (two) times daily.     furosemide  (LASIX ) 40 MG tablet Take 40 mg by mouth daily.     HYDROcodone-acetaminophen  (NORCO) 10-325 MG tablet  Take 1 tablet by mouth 2 (two) times daily as needed for severe pain.     lisinopril  (ZESTRIL ) 5 MG tablet Take 1 tablet (5 mg total) by mouth daily. (Patient not taking: Reported on 04/19/2024) 90 tablet 3   Magnesium  200 MG TABS Take 2 tablets by mouth daily.     methocarbamol  (ROBAXIN ) 500 MG tablet Take 500 mg by mouth every 8 (eight) hours as needed.     metoprolol  succinate (TOPROL  XL) 50 MG 24 hr tablet Take 1 tablet (50 mg total) by mouth daily. 90 tablet 3   Omega-3 1000 MG CAPS Take by mouth.     oxybutynin (DITROPAN) 5 MG tablet Take 5 mg by mouth 2 (two) times daily.     potassium chloride  (KLOR-CON ) 10 MEQ tablet Take 2 tablets (20 mEq total) by mouth 2 (two) times daily. 360 tablet 3   potassium chloride  SA (KLOR-CON  M) 20 MEQ tablet Take 1 tablet (20 mEq total) by mouth 2 (two) times daily. 60 tablet 5   rosuvastatin  (CRESTOR ) 20 MG tablet Take 1 tablet (20 mg total) by mouth daily. 90 tablet 3   sertraline  (ZOLOFT ) 100 MG tablet Take 100 mg by mouth daily.     sildenafil (VIAGRA) 100 MG tablet Take 100 mg by mouth daily as needed for erectile dysfunction.     tamsulosin (FLOMAX) 0.4 MG CAPS capsule Take 0.4 mg by mouth daily.     tirzepatide  (MOUNJARO ) 5 MG/0.5ML Pen Inject 5 mg into the skin once a week. 2 mL 0   tiZANidine (ZANAFLEX) 2 MG tablet Take by mouth.     traZODone  (DESYREL ) 50 MG tablet Take 100 mg by mouth at bedtime as needed for sleep.     Vibegron  (GEMTESA ) 75 MG TABS Take 1 tablet (75 mg total) by mouth daily. (Patient not taking: Reported on 04/19/2024) 30 tablet 11   No current facility-administered medications for this visit.    Allergies  Allergen Reactions   Amiodarone Nausea And Vomiting      Social History   Socioeconomic History   Marital status: Married    Spouse name: Not on file   Number of children: Not on file   Years of education: Not on file   Highest education level: Not on file  Occupational History   Occupation: retired  Tobacco Use    Smoking status: Former    Current packs/day: 0.00    Types: Cigarettes    Quit date: 1975    Years since quitting: 50.6   Smokeless tobacco: Former    Quit date: 1974  Substance and Sexual Activity   Alcohol  use: Yes   Drug use: No   Sexual activity: Yes  Other Topics Concern   Not on file  Social History Narrative   Not on file   Social Drivers of Dispensing optician  Resource Strain: Low Risk  (04/27/2024)   Received from [facility]   Overall Financial Resource Strain (CARDIA)    Difficulty of Paying Living Expenses: Not hard at all  Food Insecurity: No Food Insecurity (04/27/2024)   Received from [facility]   Hunger Vital Sign    Within the past 12 months, you worried that your food would run out before you got the money to buy more.: Never true    Within the past 12 months, the food you bought just didn't last and you didn't have money to get more.: Never true  Recent Concern: Food Insecurity - Food Insecurity Present (03/23/2024)   Received from [facility]   Hunger Vital Sign    Within the past 12 months, you worried that your food would run out before you got the money to buy more.: Sometimes true    Within the past 12 months, the food you bought just didn't last and you didn't have money to get more.: Patient declined  Transportation Needs: No Transportation Needs (04/27/2024)   Received from [facility] - Transportation    In the past 12 months, has lack of transportation kept you from medical appointments or from getting medications?: No    Lack of Transportation (Non-Medical): No  Physical Activity: Not on file  Stress: Not on file  Social Connections: Not on file  Intimate Partner Violence: Not on file      Family History  Problem Relation Age of Onset   Hypertension Father    Depression Father    Vitals:   06/22/24 1406  BP: 111/79  Pulse: 65  SpO2: 97%  Weight: 227 lb (103 kg)   Wt  Readings from Last 3 Encounters:  06/22/24 227 lb (103 kg)  04/19/24 237 lb 6 oz (107.7 kg)  04/14/24 253 lb (114.8 kg)   Lab Results  Component Value Date   CREATININE 1.09 04/19/2024   CREATININE 0.92 02/09/2024   CREATININE 1.02 05/22/2023    PHYSICAL EXAM:  General: Well appearing.  Cor: No JVD. Regular rhythm, rate.  Lungs: clear Abdomen: soft, nontender, nondistended. Extremities: trace pitting edema around bilateral ankles Neuro:. Affect pleasant   DATA REVIEW  ECHO: 06/25/21 ([facility]): LVEF >55%, mild LVH 06/05/20 ([facility]): LVEF >55%, normal RV function. 1/24:  Echo in 1/24 showed EF 55-60%, normal RV, mild-moderate MR. 8/24: Echo showed EF 55%, normal RV, biatrial enlargement, IVC normal.  Myocardial perfusion / Lexiscan ([facility]): (06/05/2020) SPECT images demonstrate homogeneous tracer distribution throughout the myocardium. Defect type:  Normal   ABIs normal in 2/22   ASSESSMENT & PLAN:  1. Chronic diastolic CHF:  Echo in 8/24 showed EF 55%, normal RV, biatrial enlargement, IVC normal.   - NYHA class II - euvolemic - weight down 10 pounds from last visit here 2 months ago - Continue Jardiance  10 mg daily.  - continue furosemide  40mg  daily. He says that he was unable to tolerate torsemide  - Continue potassium 20meq BID - really doesn't want to take additional medications since he's feeling much better, could consider MRA in the future - encouraged to continue to closely follow sodium / fluid intake, He says that he's doing much better since he realized that he can't use sea salt - compression socks daily  2. Atrial fibrillation: As per chart review, hx of atrial flutter ablation in 10/1999 & atrial fibrillation ablation (PVI & CSI) in 2012.  Surgical epicardial ablation in 06/2016.  Previously  on Tikosyn  but stopped in the past. Persistent AF since at least 2019 based on ECGs in our system. Saw Dr. Cindie with EP, did not think that repeat ablation would be  successful. Patient also asked about trying Tikosyn  again.  I spoke with Dr. Cindie about this as well and he did not think this was likely to be successful given the amount of time he has been in atrial fibrillation. However, his atria are not markedly dilated by echo.  He failed flecainide and amiodarone due to adverse side effects (severe nausea with amiodarone). Has been seen by EP at [facility] and was started back on flecainide 50 mg bid and planned for redo ablation on 02/16/24 which patient declined. Had spoken with EP 5/25.  - Watchman procedure / ablation done 05/31/24 - Continue flecainide 50mg  BID - Continue apixaban 5mg  BID.  - Continue Toprol  XL 50 mg daily   3. HTN:  - BP 111/79 - continue lisinopril  5mg  daily - BMET 06/01/24 reviewed: sodium 141, potassium 4.7, creatinine 0.74 & GFR >90 - BMET today   4. CAD:  Calcium  score 261 Agatston units in 3/24.  No chest pain.  - Continue Crestor  20mg  daily (increased in March) - LDL 02/09/24 was 116. Lipid panel today - No ASA given apixaban use.   5. OSA: Continue CPAP.   6. Obesity: BMI 29.95 kg/ m2 - not taking mounjaro    Return in 2 months, sooner if needed  Ellouise DELENA Class 06/22/2024

## 2024-06-22 NOTE — Patient Instructions (Addendum)
  Lab Work:  Labs done today, your results will be available in MyChart, we will contact you for abnormal readings.   Special Instructions // Education:  Do the following things EVERYDAY: Weigh yourself in the morning before breakfast. Write it down and keep it in a log. Take your medicines as prescribed Eat low salt foods--Limit salt (sodium) to 2000 mg per day.  Stay as active as you can everyday Limit all fluids for the day to less than 2 liters   Follow-Up in: 2 months with Dr. Mclean. Please call our office in late September to schedule this appointment.     If you have any questions or concerns before your next appointment please send us  a message through [place] or call our office at [phone], If it is after office hours your call will be answered by our answering service and directed appropriately.     At the [facility], you and your health needs are our priority. We have a designated team specialized in the treatment of Heart Failure. This Care Team includes your primary Heart Failure Specialized Cardiologist (physician), Advanced Practice Providers (APPs- Physician Assistants and Nurse Practitioners), and Pharmacist who all work together to provide you with the care you need, when you need it.   You may see any of the following providers on your designated Care Team at your next follow up:  Dr. Toribio Fuel Dr. Ezra Shuck Dr. Ria Commander Dr. Odis Brownie Greig Mosses, NP Caffie Shed, [place] [address] [place], [place] Beckey Coe, NP [place] Lee, NP Ellouise Class, NP Jaun Bash, PharmD

## 2024-06-23 ENCOUNTER — Ambulatory Visit: Payer: Self-pay | Admitting: Family

## 2024-06-27 ENCOUNTER — Telehealth: Payer: Self-pay | Admitting: Student

## 2024-06-27 NOTE — Telephone Encounter (Signed)
   Received page from answering service requesting callback in regards to medication refills.  Attempted to call patient 3 times but was unable to reach him.  I was also unable to leave a message.  Bryceton Hantz E Tavaria Mackins, PA-C 06/27/2024 4:31 PM

## 2024-06-28 ENCOUNTER — Other Ambulatory Visit: Payer: Self-pay

## 2024-06-28 MED ORDER — FUROSEMIDE 40 MG PO TABS: 40.0000 mg | ORAL_TABLET | Freq: Every day | ORAL | 5 refills | Status: DC

## 2024-07-06 ENCOUNTER — Ambulatory Visit: Payer: Medicare (Managed Care) | Admitting: Student in an Organized Health Care Education/Training Program

## 2024-08-27 ENCOUNTER — Encounter: Payer: Medicare (Managed Care) | Admitting: Cardiology

## 2024-09-23 ENCOUNTER — Other Ambulatory Visit (HOSPITAL_COMMUNITY): Payer: Self-pay

## 2024-10-19 ENCOUNTER — Telehealth: Payer: Self-pay | Admitting: Family

## 2024-10-19 ENCOUNTER — Encounter: Payer: Medicare (Managed Care) | Admitting: Cardiology

## 2024-10-19 NOTE — Telephone Encounter (Signed)
 Called to confirm/remind patient of their appointment at the [facility] on 10/20/24.   Appointment:   [x] Confirmed  [] Left mess   [] No answer/No voice mail  [] VM Full/unable to leave message  [] Phone not in service  Patient reminded to bring all medications and/or complete list.  Confirmed patient has transportation. Gave directions, instructed to utilize valet parking.

## 2024-10-19 NOTE — Progress Notes (Unsigned)
 ADVANCED HEART FAILURE CLINIC NOTE   Referring Physician: Sadie Manna, MD  Primary Care: Sadie Manna, MD HF Provider: Rolan Barrack, MD  Chief Complaint: shortness of breath   HPI:  Shane Lamb is a 73 y.o. male with hypertension, persistent atrial fibrillation, chronic prostatitis, and osteoarthritis. [name] reports that the only cardiac history he is aware of is a diagnosis of atrial fibrillation previously followed at [facility]. Based on chart review, [name] has been diagnosed with atrial fibrillation/atrial flutter dating back to at least 2000. At that time he underwent AFL ablation at [facility]. In addition, he has undergone PVI/CSI AF ablation in 2012 and a surgical epicardial ablation for atrial fibrillation in 2017. He was previously on rhythm control with Tikosyn  that he self-discontinued and since that time has been on rate control with diltiazem .  He appears to have been in atrial fibrillation since 2017.   Echo in 1/24 showed EF 55-60%, normal RV, mild-moderate MR.   He saw Dr. Cindie in 2/24 for EP evaluation.  Dr. Cindie did not think that there would be utility to repeat ablation due to low risk of maintaining NSR.  He also was interested in trying Tikosyn  again, but Dr. Cindie recommended against this.   Calcium  score scan 3/24 with CAC 261 Agatston units, 55th percentile.   Echo in 8/24 showed EF 55%, normal RV, biatrial enlargement, IVC normal.   Patient was seen by EP at [facility] in 1/25.  He was started back on flecainide with plan for atrial fibrillation ablation on 02/16/24.   Had telemedicine visit with EP 05/25 to discuss rhythm control. Patient had cancelled 04/25 ablation & tikosyn  load.   Was seen in [facility] 06/25 ago with HF exacerbation. Had been drinking too much fluids, eating too much salt and not weighing daily. Was given 2 doses of 80mg  furoscix  along with 2.5mg  metolazone  X 2 with extra potassium with each dose of furoscix . He was supposed to  use a 3rd furoscix  dose but says that he never received it in the mail.   Had Watchman procedure / ablation done 05/31/24  Patient returns for a HF follow-up visit with a chief complaint of shortness of breath. Has associated fatigue and some swelling around his ankles. Denies chest pain, palpitations, abdominal distention, dizziness. Overall, he says that he feels great. Says that he tried using some sea salt the other day and he developed more swelling so now realizes that he has to carefully monitor his sodium intake.   Labs (1/24): LDL 105, K 5, creatinine 0.87 Labs (4/24): K 4.7, creatinine 107 Labs (8/24): K 4.6, creatinine 0.9, LDL 66, hgb 16.2 Labs (10/24): K 4.8, creatinine 1.1 Labs (3/25): K 4.2, creatinine 0.92, BNP 89.1 Labs (6/25): K 3.2, creatinine 1.09 Labs (7/25)  K 4.7, creatinine 0.74  ECG not done   Past Medical History:  Diagnosis Date   Atrial fibrillation (HCC)    Hypertension    Prostatitis, chronic     Current Outpatient Medications  Medication Sig Dispense Refill   acetaminophen  (TYLENOL ) 325 MG tablet Take 650 mg by mouth every 6 (six) hours as needed.     apixaban (ELIQUIS) 5 MG TABS tablet Take 5 mg by mouth 2 (two) times daily.     B Complex-C (B-COMPLEX WITH VITAMIN C) tablet Take 1 tablet by mouth daily.     buPROPion (WELLBUTRIN XL) 150 MG 24 hr tablet Take 150 mg by mouth daily.     Cholecalciferol 50 MCG (2000 UT)  CAPS Take by mouth.     cyclobenzaprine (FLEXERIL) 10 MG tablet Take 1 tablet by mouth 3 (three) times daily as needed.     diltiazem  (CARDIZEM  CD) 240 MG 24 hr capsule Take 1 capsule (240 mg total) by mouth daily. (Patient not taking: Reported on 06/22/2024) 90 capsule 3   empagliflozin  (JARDIANCE ) 10 MG TABS tablet Take 1 tablet (10 mg total) by mouth daily before breakfast. 30 tablet 5   flecainide (TAMBOCOR) 50 MG tablet Take 50 mg by mouth 2 (two) times daily.     furosemide  (LASIX ) 40 MG tablet Take 1 tablet (40 mg total) by mouth  daily. 30 tablet 5   HYDROcodone-acetaminophen  (NORCO) 10-325 MG tablet Take 1 tablet by mouth 2 (two) times daily as needed for severe pain. (Patient not taking: Reported on 06/22/2024)     lisinopril  (ZESTRIL ) 5 MG tablet Take 1 tablet (5 mg total) by mouth daily. 90 tablet 3   Magnesium  200 MG TABS Take 2 tablets by mouth daily.     metoprolol  succinate (TOPROL  XL) 50 MG 24 hr tablet Take 1 tablet (50 mg total) by mouth daily. 90 tablet 3   Omega-3 1000 MG CAPS Take by mouth.     oxybutynin (DITROPAN) 5 MG tablet Take 5 mg by mouth 2 (two) times daily.     potassium chloride  (KLOR-CON ) 10 MEQ tablet Take 2 tablets (20 mEq total) by mouth 2 (two) times daily. 360 tablet 3   potassium chloride  SA (KLOR-CON  M) 20 MEQ tablet Take 1 tablet (20 mEq total) by mouth 2 (two) times daily. 60 tablet 5   rosuvastatin  (CRESTOR ) 20 MG tablet Take 1 tablet (20 mg total) by mouth daily. 90 tablet 3   sertraline  (ZOLOFT ) 100 MG tablet Take 100 mg by mouth daily.     sildenafil (VIAGRA) 100 MG tablet Take 100 mg by mouth daily as needed for erectile dysfunction.     tamsulosin (FLOMAX) 0.4 MG CAPS capsule Take 0.4 mg by mouth daily.     tirzepatide  (MOUNJARO ) 5 MG/0.5ML Pen Inject 5 mg into the skin once a week. (Patient not taking: Reported on 06/22/2024) 2 mL 0   traZODone  (DESYREL ) 50 MG tablet Take 100 mg by mouth at bedtime as needed for sleep.     No current facility-administered medications for this visit.    Allergies  Allergen Reactions   Amiodarone Nausea And Vomiting      Social History   Socioeconomic History   Marital status: Married    Spouse name: Not on file   Number of children: Not on file   Years of education: Not on file   Highest education level: Not on file  Occupational History   Occupation: retired  Tobacco Use   Smoking status: Former    Current packs/day: 0.00    Types: Cigarettes    Quit date: 1975    Years since quitting: 50.9   Smokeless tobacco: Former    Quit  date: 1974  Substance and Sexual Activity   Alcohol  use: Yes   Drug use: No   Sexual activity: Yes  Other Topics Concern   Not on file  Social History Narrative   Not on file   Social Drivers of Health   Financial Resource Strain: Low Risk  (04/27/2024)   Received from [facility]   Overall Financial Resource Strain (CARDIA)    Difficulty of Paying Living Expenses: Not hard at all  Food Insecurity: No Food Insecurity (04/27/2024)  Received from [facility]   Hunger Vital Sign    Within the past 12 months, you worried that your food would run out before you got the money to buy more.: Never true    Within the past 12 months, the food you bought just didn't last and you didn't have money to get more.: Never true  Recent Concern: Food Insecurity - Food Insecurity Present (03/23/2024)   Received from [facility]   Hunger Vital Sign    Within the past 12 months, you worried that your food would run out before you got the money to buy more.: Sometimes true    Within the past 12 months, the food you bought just didn't last and you didn't have money to get more.: Patient declined  Transportation Needs: No Transportation Needs (04/27/2024)   Received from [facility] - Transportation    In the past 12 months, has lack of transportation kept you from medical appointments or from getting medications?: No    Lack of Transportation (Non-Medical): No  Physical Activity: Not on file  Stress: Not on file  Social Connections: Not on file  Intimate Partner Violence: Not on file      Family History  Problem Relation Age of Onset   Hypertension Father    Depression Father    There were no vitals filed for this visit.  Wt Readings from Last 3 Encounters:  06/22/24 227 lb (103 kg)  04/19/24 237 lb 6 oz (107.7 kg)  04/14/24 253 lb (114.8 kg)   Lab Results  Component Value Date   CREATININE 0.89 06/22/2024   CREATININE 1.09  04/19/2024   CREATININE 0.92 02/09/2024    PHYSICAL EXAM:  General: Well appearing.  Cor: No JVD. Regular rhythm, rate.  Lungs: clear Abdomen: soft, nontender, nondistended. Extremities: trace pitting edema around bilateral ankles Neuro:. Affect pleasant   DATA REVIEW  ECHO: 06/25/21 ([facility]): LVEF >55%, mild LVH 06/05/20 ([facility]): LVEF >55%, normal RV function. 1/24:  Echo in 1/24 showed EF 55-60%, normal RV, mild-moderate MR. 8/24: Echo showed EF 55%, normal RV, biatrial enlargement, IVC normal.  Myocardial perfusion / Lexiscan ([facility]): (06/05/2020) SPECT images demonstrate homogeneous tracer distribution throughout the myocardium. Defect type:  Normal   ABIs normal in 2/22   ASSESSMENT & PLAN:  1. Chronic diastolic CHF:  Echo in 8/24 showed EF 55%, normal RV, biatrial enlargement, IVC normal.   - NYHA class II - euvolemic - weight down 10 pounds from last visit here 2 months ago - Continue Jardiance  10 mg daily.  - continue furosemide  40mg  daily. He says that he was unable to tolerate torsemide  - Continue potassium 20meq BID - really doesn't want to take additional medications since he's feeling much better, could consider MRA in the future - encouraged to continue to closely follow sodium / fluid intake, He says that he's doing much better since he realized that he can't use sea salt - compression socks daily  2. Atrial fibrillation: As per chart review, hx of atrial flutter ablation in 10/1999 & atrial fibrillation ablation (PVI & CSI) in 2012.  Surgical epicardial ablation in 06/2016.  Previously on Tikosyn  but stopped in the past. Persistent AF since at least 2019 based on ECGs in our system. Saw Dr. Cindie with EP, did not think that repeat ablation would be successful. Patient also asked about trying Tikosyn  again.  I spoke with Dr. Cindie about this as well and he did not think  this was likely to be successful given the amount of time he has been in atrial fibrillation.  However, his atria are not markedly dilated by echo.  He failed flecainide and amiodarone due to adverse side effects (severe nausea with amiodarone). Has been seen by EP at [facility] and was started back on flecainide 50 mg bid and planned for redo ablation on 02/16/24 which patient declined. Had spoken with EP 5/25.  - Watchman procedure / ablation done 05/31/24 - Continue flecainide 50mg  BID - Continue apixaban 5mg  BID.  - Continue Toprol  XL 50 mg daily   3. HTN:  - BP 111/79 - continue lisinopril  5mg  daily - BMET 06/01/24 reviewed: sodium 141, potassium 4.7, creatinine 0.74 & GFR >90 - BMET today   4. CAD:  Calcium  score 261 Agatston units in 3/24.  No chest pain.  - Continue Crestor  20mg  daily (increased in March) - LDL 02/09/24 was 116. Lipid panel today - No ASA given apixaban use.   5. OSA: Continue CPAP.   6. Obesity: BMI 29.95 kg/ m2 - not taking mounjaro    Return in 2 months, sooner if needed  Ellouise DELENA Class 10/19/2024

## 2024-10-20 ENCOUNTER — Ambulatory Visit: Payer: Medicare (Managed Care) | Attending: Family | Admitting: Family

## 2024-10-20 ENCOUNTER — Encounter: Payer: Self-pay | Admitting: Family

## 2024-10-20 DIAGNOSIS — G4733 Obstructive sleep apnea (adult) (pediatric): Secondary | ICD-10-CM | POA: Diagnosis not present

## 2024-10-20 DIAGNOSIS — M25561 Pain in right knee: Secondary | ICD-10-CM | POA: Insufficient documentation

## 2024-10-20 DIAGNOSIS — N411 Chronic prostatitis: Secondary | ICD-10-CM | POA: Insufficient documentation

## 2024-10-20 DIAGNOSIS — F1910 Other psychoactive substance abuse, uncomplicated: Secondary | ICD-10-CM

## 2024-10-20 DIAGNOSIS — M1990 Unspecified osteoarthritis, unspecified site: Secondary | ICD-10-CM | POA: Insufficient documentation

## 2024-10-20 DIAGNOSIS — E669 Obesity, unspecified: Secondary | ICD-10-CM | POA: Diagnosis not present

## 2024-10-20 DIAGNOSIS — Z79899 Other long term (current) drug therapy: Secondary | ICD-10-CM | POA: Insufficient documentation

## 2024-10-20 DIAGNOSIS — Z7901 Long term (current) use of anticoagulants: Secondary | ICD-10-CM | POA: Insufficient documentation

## 2024-10-20 DIAGNOSIS — I10 Essential (primary) hypertension: Secondary | ICD-10-CM | POA: Diagnosis not present

## 2024-10-20 DIAGNOSIS — I5032 Chronic diastolic (congestive) heart failure: Secondary | ICD-10-CM | POA: Insufficient documentation

## 2024-10-20 DIAGNOSIS — I110 Hypertensive heart disease with heart failure: Secondary | ICD-10-CM | POA: Insufficient documentation

## 2024-10-20 DIAGNOSIS — I480 Paroxysmal atrial fibrillation: Secondary | ICD-10-CM | POA: Diagnosis not present

## 2024-10-20 DIAGNOSIS — F17210 Nicotine dependence, cigarettes, uncomplicated: Secondary | ICD-10-CM | POA: Insufficient documentation

## 2024-10-20 DIAGNOSIS — Z6829 Body mass index (BMI) 29.0-29.9, adult: Secondary | ICD-10-CM | POA: Insufficient documentation

## 2024-10-20 DIAGNOSIS — I2510 Atherosclerotic heart disease of native coronary artery without angina pectoris: Secondary | ICD-10-CM | POA: Diagnosis not present

## 2024-10-20 DIAGNOSIS — Z7984 Long term (current) use of oral hypoglycemic drugs: Secondary | ICD-10-CM | POA: Insufficient documentation

## 2024-10-20 DIAGNOSIS — F1090 Alcohol use, unspecified, uncomplicated: Secondary | ICD-10-CM | POA: Insufficient documentation

## 2024-10-20 DIAGNOSIS — I4819 Other persistent atrial fibrillation: Secondary | ICD-10-CM | POA: Insufficient documentation

## 2024-10-20 MED ORDER — POTASSIUM CHLORIDE CRYS ER 20 MEQ PO TBCR: 20.0000 meq | EXTENDED_RELEASE_TABLET | Freq: Two times a day (BID) | ORAL | 5 refills | Status: AC

## 2024-10-20 MED ORDER — EMPAGLIFLOZIN 10 MG PO TABS: 10.0000 mg | ORAL_TABLET | Freq: Every day | ORAL | 5 refills | Status: DC

## 2024-10-20 MED ORDER — FUROSEMIDE 40 MG PO TABS: 40.0000 mg | ORAL_TABLET | Freq: Every day | ORAL | 5 refills | Status: AC

## 2024-10-20 NOTE — Patient Instructions (Addendum)
 Medication Changes:  RESTART Jardiance  10mg  tab daily  Lab Work:  Go downstairs to [facility] on [address] to have your blood work completed.  We will only call you if the results are abnormal or if the provider would like to make medication changes.  No news is good news.   Follow-Up in: Please follow up with the Advanced [facility] Failure Clinic in 3 months with Dr. Rolan. We do not currently have that schedule. Please give us  a call in February 2026 in order to schedule your appointment for March 2026.   Thank you for choosing [facility].    At the Advanced [facility] Failure Clinic, you and your health needs are our priority. We have a designated team specialized in the treatment of Heart Failure. This Care Team includes your primary Heart Failure Specialized Cardiologist (physician), Advanced Practice Providers (APPs- Physician Assistants and Nurse Practitioners), and Pharmacist who all work together to provide you with the care you need, when you need it.   You may see any of the following providers on your designated Care Team at your next follow up:  Dr. Toribio Fuel Dr. Ezra Rolan Dr. Ria Commander Dr. Morene Brownie Ellouise Class, FNP Jaun Bash, RPH-CPP  Please be sure to bring in all your medications bottles to every appointment.   Need to Contact Us :  If you have any questions or concerns before your next appointment please send us  a message through [place] or call our office at [phone].    TO LEAVE A MESSAGE FOR THE NURSE SELECT OPTION 2, PLEASE LEAVE A MESSAGE INCLUDING: YOUR NAME DATE OF BIRTH CALL BACK NUMBER REASON FOR CALL**this is important as we prioritize the call backs  YOU WILL RECEIVE A CALL BACK THE SAME DAY AS LONG AS YOU CALL BEFORE 4:00 PM

## 2024-10-21 ENCOUNTER — Ambulatory Visit: Payer: Self-pay | Admitting: Family

## 2024-10-21 LAB — BASIC METABOLIC PANEL WITH GFR
BUN/Creatinine Ratio: 15 (ref 10–24)
BUN: 19 mg/dL (ref 8–27)
Calcium: 9.5 mg/dL (ref 8.6–10.2)
Chloride: 102 mmol/L (ref 96–106)
Creatinine, Ser: 1.3 mg/dL — ABNORMAL HIGH (ref 0.76–1.27)
Glucose: 97 mg/dL (ref 70–99)
Potassium: 4.8 mmol/L (ref 3.5–5.2)
Sodium: 138 mmol/L (ref 134–144)
eGFR: 57 mL/min/1.73 — ABNORMAL LOW (ref >59)

## 2024-10-22 ENCOUNTER — Telehealth: Payer: Self-pay

## 2024-10-22 NOTE — Telephone Encounter (Signed)
 Re-routed latest EKG to [facility] per provider request.

## 2024-10-29 NOTE — Telephone Encounter (Signed)
 Called pt. Pt states that he will start jardiance . He also states he will go get blood work at [facility] on 11/15/24. Requested mychart message to remind him of the date. No further questions at this time.

## 2024-11-20 ENCOUNTER — Other Ambulatory Visit: Payer: Self-pay | Admitting: Urology

## 2024-12-02 NOTE — Progress Notes (Signed)
 Chief Complaint  Patient presents with   Urinary Incontinence   Back Pain    HPI  Shane Lamb is a 74 y.o. here for a Follow up and a Medicare Wellness visit  Hx of A-fib- s/p Maze procedure in 2017, HTN, OA knees and hips,OSA, A- fib  and Depression. 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 Recent labs: Hgb: 16.2,Sugar:97 A1c; 6.2, BUN; 27  Se Creat; 1.30, Total Cholesterol; 146, Triglycerides; 144 and TSH; 2.057 and PSA:,2.86     ROS Rest of 10 point review of systems is normal.  Outpatient Encounter Medications as of 12/02/2024  Medication Sig Dispense Refill   cholecalciferol (VITAMIN D3) 2,000 unit capsule Take 30,000 Units by mouth once daily Pt. States he takes 30,000     FUROsemide  (LASIX ) 20 MG tablet TAKE ONE TABLET BY MOUTH ONE TIME DAILY 90 tablet 1   GEMTESA  75 mg Tab      magnesium  200 mg Take 400 mg by mouth once daily       metoprolol  SUCCinate (TOPROL -XL) 50 MG XL tablet Take 1 tablet by mouth once daily     omega-3 fatty acids-fish oil 300-1,000 mg capsule Take by mouth once daily     oxyBUTYnin (DITROPAN) 5 mg tablet Take 1 tablet (5 mg total) by mouth 2 (two) times daily for 180 days 180 tablet 1   oxyBUTYnin (DITROPAN) 5 mg tablet TAKE ONE TABLET BY MOUTH THREE TIMES A DAY 90 tablet 0   potassium chloride  (KLOR-CON ) 10 MEQ ER tablet Take 1 tablet by mouth once daily     rosuvastatin  (CRESTOR ) 10 MG tablet Take 1 tablet by mouth once daily     sertraline  (ZOLOFT ) 100 MG tablet TAKE ONE TABLET BY MOUTH ONE TIME  DAILY 90 tablet 1   sildenafiL (VIAGRA) 100 MG tablet Take 1 tablet (100 mg total) by mouth once daily as needed for Erectile Dysfunction for up to 90 days 90 tablet 0   tamsulosin (FLOMAX) 0.4 mg capsule TAKE ONE CAPSULE BY MOUTH ONE TIME DAILY 30 MINUTES AFTER THE SAME MEAL EACH DAY 90 capsule 1   traZODone  (DESYREL ) 100 MG tablet TAKE ONE TABLET BY MOUTH AT BEDTIME 30 tablet 2   ubidecarenone (CO Q-10 ORAL) Take 2 capsules by mouth once daily       buPROPion (WELLBUTRIN XL) 150 MG XL tablet Take 1 tablet (150 mg total) by mouth once daily 30 tablet 11   cyclobenzaprine (FLEXERIL) 10 MG tablet Take 10 mg by mouth 3 (three) times daily as needed for Muscle spasms     cyclobenzaprine (FLEXERIL) 10 MG tablet TAKE ONE TABLET BY MOUTH THREE TIMES A DAY AS NEEDED FOR MUSCLE SPASMS *DO NOT TAKE WITH TIZANIDINE* 90 tablet 0   dilTIAZem  (CARDIZEM  CD) 360 MG CD capsule Take 1 capsule (360 mg total) by mouth once daily 90 capsule 1   dilTIAZem  (CARDIZEM ) 30 MG immediate release tablet Take 1 tablet (30 mg total) by mouth 4 (four) times daily 180 tablet 1  ELIQUIS 5 mg tablet Take 1 tablet (5 mg total) by mouth every 12 (twelve) hours for 364 days 60 tablet 5   JARDIANCE  10 mg tablet Take 10 mg by mouth once daily     lisinopriL  (ZESTRIL ) 5 MG tablet Take 1 tablet by mouth once daily     traZODone  (DESYREL ) 50 MG tablet Take 1 tablet (50 mg total) by mouth at bedtime as needed for Sleep 30 tablet 5   VITAMIN K2 ORAL Take 1 tablet by mouth once daily        No facility-administered encounter medications on file as of 12/02/2024.    Allergies as of 12/02/2024 - Reviewed 12/02/2024  Allergen Reaction Noted   Amiodarone Vomiting     Past Medical History:  Diagnosis Date   Allergy poison oak   Anxiety    Arrhythmia 20years   Atrial fibrillation (CMS/HHS-HCC) 05/17/2012     Drug  HX Current Rx Pre-ABL inefficacy Pre-ABL intolerant Post-ABL inefficacy Post-ABL intolerant max  dose/24h M/Y end comments sotalol            750mcg     dofetilide   X               flecainide                 propafenone                 amiodarone                 dronedarone                 other                    Ablation Hx date done @[facility]  @outside  facility PVI LA extrapul TV-IVC CFAE com   Coronary artery disease involving native coronary artery of native heart 04/15/2017   Depression 20 years ago   Diabetes mellitus type 2, uncomplicated (CMS/HHS-HCC)    GERD (gastroesophageal reflux disease)    watches his diet   Heart murmur notknown   History of cataract starting   HTN (hypertension) 05/17/2012   Kidney disease    OSA on CPAP 05/17/2012   Osteoarthritis    Poor intravenous access    Prostatitis 2013   With Surgery    S/P ablation of atrial flutter 12/12/2016   Sleep apnea     Past Surgical History:  Procedure Laterality Date   ABLATION ARRYTHMIA FOCUS  2012   AF ablation   makoplasty knee Left 04/01/2016   medial   ENDOSCOPIC ABLATION & RECONSTRUCTION ATRIA N/A 06/28/2016   Procedure: Laparoscopy, SURGICAL; OPERATIVE TISSUE ABLATION AND RECONSTRUCTION OF ATRIA, EXTENSIVE (EG, MAZE PROCEDURE), WITHOUT CARDIOPULMONARY BYPASS;  Surgeon: Christopher DELENA Eth, MD;  Location: [facility];  Service: Cardiothoracic;  Laterality: N/A;   FRACTURE SURGERY     as a child   JOINT REPLACEMENT     partial left knee   KNEE ARTHROSCOPY  date  not  known   LASER VAPORIZATION PROSTATE     Greenlight laser   PROSTATE SURGERY     TONSILLECTOMY      Vitals:   12/02/24 1020  BP: 134/82  Pulse: 84    Body mass index is 31.57 kg/m.   No visits with results within 3 Month(s) from this visit.  Latest known visit with results is:  Office Visit on 12/12/2023  Component Date Value Ref Range Status   Creatinine, Random Urine 12/12/2023 75.9  40.0 - 300.0 mg/dL Final   Urine Albumin, Random 12/12/2023 9    mg/L Final   Urine Albumin/Creatinine Ratio 12/12/2023  11.9  <30.0 ug/mg Final   Urine:         Spot collection              (g/mg creatinine)     Normal               < 30   Moderately          30-299         increased   Clinical             >=300 albuminuria   Color 12/12/2023 Light Yellow  Colorless, Straw, Light Yellow, Yellow, Dark Yellow Final   Clarity 12/12/2023 Clear  Clear Final   Specific Gravity 12/12/2023 1.025  1.005 - 1.030 Final   pH, Urine 12/12/2023 5.5  5.0 - 8.0 Final   Protein, Urinalysis 12/12/2023 Negative  Negative mg/dL Final   Glucose, Urinalysis 12/12/2023 4+ (!)  Negative mg/dL Final   Ketones, Urinalysis 12/12/2023 Negative  Negative mg/dL Final   Blood, Urinalysis 12/12/2023 Negative  Negative Final   Nitrite, Urinalysis 12/12/2023 Negative  Negative Final   Leukocyte Esterase, Urinalysis 12/12/2023 Trace (!)  Negative Final   Bilirubin, Urinalysis 12/12/2023 Negative  Negative Final   Urobilinogen, Urinalysis 12/12/2023 0.2  0.2 - 1.0 mg/dL Final   WBC, UA 98/68/7974 4  <=5 /hpf Final   Red Blood Cells, Urinalysis 12/12/2023 1  <=3 /hpf Final   Bacteria, Urinalysis 12/12/2023 0-5  0 - 5 /hpf Final   Squamous Epithelial Cells, Urinaly* 12/12/2023 2  /hpf Final    Exam   Blood pressure 134/82, pulse 84, height 185.4 cm (6' 0.99), weight (!) 108.5 kg (239 lb 3.2 oz), SpO2 96%.   Body mass index is 31.57 kg/m.  ' Wt Readings from Last 3 Encounters:  12/02/24 (!) 108.5 kg (239 lb 3.2 oz)  09/30/24 (!) 108.5 kg (239 lb 3.2 oz)  04/27/24 (!) 108.5 kg (239 lb 3.2 oz)     General. Alert oriented x3  Eyes. Sclera and conjunctiva clear; pupils equal round and reactive to light ; extraocular movements intact Ears. External normal; canals clear;  Nose. Mucosa healthy without drainage or ulceration Oropharynx. No suspicious lesions Neck. No swelling, masses, stiffness, pain, limited movement, carotid pulses normal bilaterally, thyroid  normal size, no masses palpated.  No  bruits Lungs. Respirations unlabored; clear to auscultation bilaterally Back. No spinal deformity Cardiovascular. Heart regular rate and rhythm 2/6 SEM noted No  gallops, or rubs Abdomen. Soft; non tender; non distended; normoactive bowel sounds; no masses or organomegaly RECTAL: Declined  Lymph Nodes. No significant cervical, supraclavicular, axillary or inguinal lymphadenopathy noted Musculoskeletal. Rt knee brace noted  Tender cystic swelling distal aspect of Left middle finger with early  abscess formation  Extremities.venous stasis dermatitis  1 +edema   Neurologic. Alert and oriented; speech intact; face symmetrical; moves all extremities well DTR's intact    Assessment and Plan  1 OA with pain and swelling Rt knee; Pt is scheduled for Rt TKA soon  2 Bipolar Depression  On Zoloft  100 mg po qd Continue Wellbutrin  3 ADD-Predominantly attention deficit;  States he is doing better  4 Leg cramps;Improved- Pickle juice helped  5 HTN:- On Diltiazem  360 mg po qd and  Losartan  25 mg po qd 6 Pain in low back with Rt sciatica;Injection therapy has helped 7 OSA: On CPAP  8 Chronic heart failure with preserved EF/ Paroxysmal A-fib; On   Diltiazem  and Eliquis  -Underwent ablation  and watchman procedure - sees Cardiology   Sees Cardiologist Dr. Mady  9 BPH/ urinary incontinence:Underwent laser procedure approx 5 yrs back  On Gemtesa  10 Leg edema; Improved- On Lasix   11 Health Maintenance: Up to date with Flu shot  Tdap  Pneumovax and COVID 19 vaccine Colonoscopy approx 10 yrs back- 1 polyp- Cologuard test was Negative  Refer GI for Colonoscopy  Handicapped Parking Form filled out  Labs 1 week prior to next visit  Continue efforts at weight reduction  Follow up in 4 months    Tamra Leventhal  MD  Shane Lamb is a 74 y.o. here for Medicare Wellness Visit  MEDICARE WELLNESS VISIT  Providers Rendering Care 1. Dr. Tamra Hande(PCP)  Functional Assessment (1)  Hearing: Demonstrates no difficulty in hearing during normal conversation (2) Risk of Falls: Patient denies any falls or near falls in the last year, Gait is unsteady without assistance during walk from waiting area to exam room Uses a cane  (3) Home Safety: Patient feels secure in their home, There are operational smoke alarms in multiple areas of the home (4) Activities of Daily Living: Independently manages personal grooming and household chores, including cooking, cleaning and laundry. Manages Personal finances without assistance.  Depression Screening PHQ 2/9 last 3 flowsheet values     06/17/2023    4:05 PM 07/31/2023    2:31 PM 09/23/2023   10:57 AM  PHQ-2/9 Depression Screening   Little interest or pleasure in doing things  * *  Feeling down, depressed, or hopeless * * *  Patient Health Questionnaire-2 Score  * *  Trouble falling or staying asleep, or sleeping too much * * *  Feeling tired or having little energy * * *  Poor appetite or overeating * * *  Feeling bad about yourself - or that you are a failure or have let yourself or your family down * * *  Trouble concentrating on things, such as reading the newspaper or watching television * * *  Moving or speaking so slowly that other people could have noticed? Or the opposite - being so fidgety or restless that you have been moving around a lot more than usual. * * *  Thoughts that you would be better off dead or hurting yourself in some way * * *  How difficult have these problems made it for you to do your work, take care of things at home, or get along with other people?  *   Patient Health Questionnaire-9 Score  * *    * Information is confidential and restricted. Go to Review Flowsheets to unlock data.     Depression Severity and Treatment Recommendations:  0-4= None  5-9= Mild / Treatment: Support, educate to call if worse; return in one month  10-14= Moderate / Treatment: Support, watchful waiting; Antidepressant  or Psychotherapy  15-19= Moderately severe / Treatment: Antidepressant OR Psychotherapy  >= 20 = Major depression, severe / Antidepressant AND Psychotherapy On Medications for Depression   Cognitive Impairment Patient denies episodes of loosing things, being forgetful. Seems oriented to person, place and time.  Responses appear appropriate and timely to this observer.  PREVENTION PLAN  Cardiovascular: FLP assessed; 02/08/14; LDL; 116  Diabetes: A1c or FBG assessed; 06/19/23; A1c; 6.4  Glaucoma: N/A Hepatitis B (HBV) Vaccine:  Not Applicable Smoking Cessation:  Not Applicable  Other Personalized Health Advice  Encouraged patient to exercise 5 days a week, walking, water aerobics, gentle stretching recommended. Increase dietary intake of fresh fruits and vegetables, reduce red meat to twice a week.  End of Life Counseling Patient has living will in place; Has designated his daughter as his POA - ; Full Code Declines prolonged and futile life sustaining measures   Current Outpatient Medications  Medication Sig Dispense Refill   cholecalciferol (VITAMIN D3) 2,000 unit capsule Take 30,000 Units by mouth once daily Pt. States he takes 30,000     FUROsemide  (LASIX ) 20 MG tablet TAKE ONE TABLET BY MOUTH ONE TIME DAILY 90 tablet 1   GEMTESA  75 mg Tab      magnesium  200 mg Take 400 mg by mouth once daily       metoprolol  SUCCinate (TOPROL -XL) 50 MG XL tablet Take 1 tablet by mouth once daily     omega-3 fatty acids-fish oil 300-1,000 mg capsule Take by mouth once daily     oxyBUTYnin (DITROPAN) 5 mg tablet Take 1 tablet (5 mg total) by mouth 2 (two) times daily for 180 days 180 tablet 1   oxyBUTYnin (DITROPAN) 5 mg tablet TAKE ONE TABLET BY MOUTH THREE TIMES A DAY 90 tablet 0   potassium chloride  (KLOR-CON ) 10 MEQ ER tablet Take 1 tablet by mouth once daily     rosuvastatin  (CRESTOR ) 10 MG tablet Take 1 tablet by mouth once daily     sertraline  (ZOLOFT ) 100 MG tablet TAKE ONE  TABLET BY MOUTH ONE TIME DAILY 90 tablet 1   sildenafiL (VIAGRA) 100 MG tablet Take 1 tablet (100 mg total) by mouth once daily as needed for Erectile Dysfunction for up to 90 days 90 tablet 0   tamsulosin (FLOMAX) 0.4 mg capsule TAKE ONE CAPSULE BY MOUTH ONE TIME DAILY 30 MINUTES AFTER THE SAME MEAL EACH DAY 90 capsule 1   traZODone  (DESYREL ) 100 MG tablet TAKE ONE TABLET BY MOUTH AT BEDTIME 30 tablet 2   ubidecarenone (CO Q-10 ORAL) Take 2 capsules by mouth once daily       buPROPion (WELLBUTRIN XL) 150 MG XL tablet Take 1 tablet (150 mg total) by mouth once daily 30 tablet 11   cyclobenzaprine (FLEXERIL) 10 MG tablet Take 10 mg by mouth 3 (three) times daily as needed for Muscle spasms     cyclobenzaprine (FLEXERIL) 10 MG tablet TAKE ONE TABLET BY MOUTH THREE TIMES A DAY AS NEEDED FOR MUSCLE SPASMS *DO NOT TAKE WITH TIZANIDINE* 90 tablet 0   dilTIAZem  (CARDIZEM  CD) 360 MG CD capsule Take 1 capsule (360 mg total) by mouth once daily 90 capsule 1   dilTIAZem  (CARDIZEM ) 30 MG immediate release tablet Take 1 tablet (30 mg total) by mouth 4 (four) times daily 180 tablet 1   ELIQUIS 5 mg tablet Take 1 tablet (5 mg total) by mouth every 12 (twelve) hours for 364 days 60 tablet 5   JARDIANCE  10 mg tablet Take 10 mg by mouth once daily     lisinopriL  (ZESTRIL ) 5 MG tablet Take 1 tablet by mouth once daily     traZODone  (DESYREL ) 50 MG tablet Take 1 tablet (50 mg total) by mouth at bedtime as needed for Sleep 30 tablet 5   VITAMIN K2 ORAL Take 1 tablet by mouth once daily        No current facility-administered medications for this visit.    Allergies as of 12/02/2024 - Reviewed 12/02/2024  Allergen Reaction Noted   Amiodarone Vomiting  Patient Active Problem List  Diagnosis   HTN (hypertension)   OSA (obstructive sleep apnea)   High risk medications (not anticoagulants) long-term use   Primary osteoarthritis of left hip   Primary osteoarthritis of left knee   Primary  osteoarthritis of right knee   Degeneration of cartilage in a joint   Primary osteoarthritis of right hip   Status post unicompartmental knee replacement, left   S/P Maze operation for atrial fibrillation   Chronic pain of left knee   Status post left unicompartmental knee replacement   BMI 31.0-31.9,adult   Benign non-nodular prostatic hyperplasia with lower urinary tract symptoms   Elevated PSA   Prediabetes   History of ventricular tachycardia   PAF (paroxysmal atrial fibrillation) (CMS/HHS-HCC)   Wide-complex tachycardia   Coronary artery disease involving native coronary artery of native heart   Atypical atrial flutter (CMS/HHS-HCC)   Chronic anticoagulation-Apixaban Patient Assistance 05/11/18-11/10/18   Bilateral hip joint arthritis   Cellulitis   Chronic low back pain   Chronic pain syndrome   Erectile dysfunction of organic origin   Spondylosis of lumbar region without myelopathy or radiculopathy   Pain due to onychomycosis of toenails of both feet   Presence of left artificial knee joint   Pain in limb   Swelling of limb   Major depressive disorder, recurrent, in remission   Elevated blood sugar level   Chronic heart failure with preserved ejection fraction (CMS/HHS-HCC)    Past Medical History:  Diagnosis Date   Allergy poison oak   Anxiety    Arrhythmia 20years   Atrial fibrillation (CMS/HHS-HCC) 05/17/2012     Drug  HX Current Rx Pre-ABL inefficacy Pre-ABL intolerant Post-ABL inefficacy Post-ABL intolerant max dose/24h M/Y end comments sotalol            750mcg     dofetilide   X               flecainide                 propafenone                 amiodarone                 dronedarone                 other                    Ablation Hx date done @[facility]  @outside  facility PVI LA extrapul TV-IVC CFAE com   Coronary artery disease involving native coronary artery of native heart 04/15/2017   Depression 20 years ago   Diabetes mellitus  type 2, uncomplicated (CMS/HHS-HCC)    GERD (gastroesophageal reflux disease)    watches his diet   Heart murmur notknown   History of cataract starting   HTN (hypertension) 05/17/2012   Kidney disease    OSA on CPAP 05/17/2012   Osteoarthritis    Poor intravenous access    Prostatitis 2013   With Surgery    S/P ablation of atrial flutter 12/12/2016   Sleep apnea     Past Surgical History:  Procedure Laterality Date   ABLATION ARRYTHMIA FOCUS  2012   AF ablation   makoplasty knee Left 04/01/2016   medial   ENDOSCOPIC ABLATION & RECONSTRUCTION ATRIA N/A 06/28/2016   Procedure: Laparoscopy, SURGICAL; OPERATIVE TISSUE ABLATION AND RECONSTRUCTION OF ATRIA, EXTENSIVE (EG, MAZE PROCEDURE), WITHOUT CARDIOPULMONARY BYPASS;  Surgeon: Christopher DELENA Eth, MD;  Location: [facility];  Service: Cardiothoracic;  Laterality: N/A;   FRACTURE SURGERY     as a child   JOINT REPLACEMENT     partial left knee   KNEE ARTHROSCOPY  date  not  known   LASER VAPORIZATION PROSTATE     Greenlight laser   PROSTATE SURGERY     TONSILLECTOMY      Health Maintenance  Topic Date Due   Shingrix (1 of 2) Never done   AAA Screen  Never done   Colorectal Cancer Screening  06/02/2020   Medicare Subsequent AWV H9560  04/23/2024   RSV Immunization Pregnant or 50+ (1 - 1-dose 75+ series) Never done   Lipid Panel  06/18/2024   COVID-19 Vaccine (5 - 2025-26 season) 07/12/2024   Creatinine Level  09/02/2024   Potassium Level  09/02/2024   Serum Calcium   09/02/2024   Depression Screening  09/22/2024   Annual Physical/Well Child Check  12/12/2024   PSA  06/18/2025   Diabetes Screening  06/18/2026   Adult Tetanus (Td And Tdap)  12/29/2030   Medicare Initial AWV H9561  Completed   Pneumococcal Vaccine: 50+  Completed   Hepatitis C Screen  Completed   Influenza Vaccine  Completed   Hib Vaccines  Aged Out   Hepatitis A Vaccines  Aged Out   Meningococcal B Vaccine   Aged Out   Meningococcal ACWY Vaccine  Aged Out   HPV Vaccines  Aged Out    Vitals:   12/02/24 1020  BP: 134/82  Pulse: 84  SpO2: 96%  Weight: (!) 108.5 kg (239 lb 3.2 oz)  Height: 185.4 cm (6' 0.99)  PainSc:   3  PainLoc: Knee   Body mass index is 31.57 kg/m.  Assessment/Plan  1. Medicare wellness visit- Medications and allergies reviewed. Copy of preventative health provided.  Labs reviewed.   TAMRA ETHELENE LEVENTHAL, MD        *Some images could not be shown.

## 2024-12-09 ENCOUNTER — Other Ambulatory Visit (HOSPITAL_COMMUNITY): Payer: Self-pay

## 2024-12-09 ENCOUNTER — Telehealth: Payer: Self-pay

## 2024-12-09 ENCOUNTER — Other Ambulatory Visit: Payer: Self-pay

## 2024-12-09 ENCOUNTER — Telehealth: Payer: Self-pay | Admitting: Pharmacist

## 2024-12-09 MED ORDER — EMPAGLIFLOZIN 10 MG PO TABS
10.0000 mg | ORAL_TABLET | Freq: Every day | ORAL | 3 refills | Status: AC
  Filled 2024-12-09: qty 90, 90d supply, fill #0

## 2024-12-09 MED ORDER — METOPROLOL SUCCINATE ER 50 MG PO TB24: 50.0000 mg | ORAL_TABLET | Freq: Every day | ORAL | 1 refills | Status: AC

## 2024-12-09 NOTE — Telephone Encounter (Signed)
 Advanced Heart Failure Patient Advocate Encounter  This patient was approved for a [employer] grant that will help cover the cost of Eliquis, Jardiance , Metoprolol .  Total amount awarded, $7,500.  Effective: 11/09/2024 - 11/08/2025.  BIN [id number] PCN [id number] Group [id number] ID [id number]  Approval and processing information added to [facility]. Patient informed while in office.  Rachel DEL, CPhT Rx Patient Advocate Phone: [phone]

## 2024-12-09 NOTE — Telephone Encounter (Addendum)
 Patient called stating he stopped taking Jardiance  because it was too expensive. Lorrene renewed by patient advocate and patient will restart Jardiance  today.
